# Patient Record
Sex: Female | Born: 1970 | Race: Black or African American | Hispanic: No | Marital: Married | Smoking: Current every day smoker
Health system: Southern US, Community
[De-identification: ages and names within clinical notes are randomized; demographics above are authoritative.]

## PROBLEM LIST (undated history)

## (undated) DIAGNOSIS — M7580 Other shoulder lesions, unspecified shoulder: Secondary | ICD-10-CM

## (undated) DIAGNOSIS — F32A Depression, unspecified: Secondary | ICD-10-CM

## (undated) DIAGNOSIS — G5792 Unspecified mononeuropathy of left lower limb: Secondary | ICD-10-CM

## (undated) DIAGNOSIS — M722 Plantar fascial fibromatosis: Secondary | ICD-10-CM

## (undated) DIAGNOSIS — J45909 Unspecified asthma, uncomplicated: Secondary | ICD-10-CM

## (undated) DIAGNOSIS — Z87440 Personal history of urinary (tract) infections: Secondary | ICD-10-CM

## (undated) DIAGNOSIS — K219 Gastro-esophageal reflux disease without esophagitis: Secondary | ICD-10-CM

## (undated) DIAGNOSIS — E669 Obesity, unspecified: Secondary | ICD-10-CM

## (undated) DIAGNOSIS — E119 Type 2 diabetes mellitus without complications: Secondary | ICD-10-CM

## (undated) DIAGNOSIS — R82998 Other abnormal findings in urine: Secondary | ICD-10-CM

## (undated) DIAGNOSIS — I10 Essential (primary) hypertension: Secondary | ICD-10-CM

## (undated) DIAGNOSIS — F329 Major depressive disorder, single episode, unspecified: Secondary | ICD-10-CM

## (undated) DIAGNOSIS — M1990 Unspecified osteoarthritis, unspecified site: Secondary | ICD-10-CM

## (undated) HISTORY — DX: Unspecified osteoarthritis, unspecified site: M19.90

## (undated) HISTORY — DX: Unspecified asthma, uncomplicated: J45.909

## (undated) HISTORY — DX: Gastro-esophageal reflux disease without esophagitis: K21.9

## (undated) HISTORY — PX: FOOT SURGERY: SHX648

## (undated) HISTORY — DX: Depression, unspecified: F32.A

## (undated) HISTORY — DX: Essential (primary) hypertension: I10

## (undated) HISTORY — DX: Unspecified mononeuropathy of left lower limb: G57.92

## (undated) HISTORY — DX: Other abnormal findings in urine: R82.998

## (undated) HISTORY — DX: Major depressive disorder, single episode, unspecified: F32.9

## (undated) HISTORY — DX: Other shoulder lesions, unspecified shoulder: M75.80

## (undated) HISTORY — DX: Plantar fascial fibromatosis: M72.2

## (undated) HISTORY — DX: Obesity, unspecified: E66.9

## (undated) HISTORY — DX: Personal history of urinary (tract) infections: Z87.440

## (undated) HISTORY — DX: Type 2 diabetes mellitus without complications: E11.9

---

## 2006-06-21 ENCOUNTER — Emergency Department: Payer: Self-pay | Admitting: Emergency Medicine

## 2009-04-20 ENCOUNTER — Emergency Department: Payer: Self-pay | Admitting: Emergency Medicine

## 2009-09-04 ENCOUNTER — Emergency Department: Payer: Self-pay | Admitting: Unknown Physician Specialty

## 2010-09-11 ENCOUNTER — Ambulatory Visit: Payer: Self-pay | Admitting: Family Medicine

## 2010-11-08 ENCOUNTER — Emergency Department: Payer: Self-pay | Admitting: Unknown Physician Specialty

## 2011-06-01 DIAGNOSIS — IMO0001 Reserved for inherently not codable concepts without codable children: Secondary | ICD-10-CM | POA: Insufficient documentation

## 2011-06-01 DIAGNOSIS — L309 Dermatitis, unspecified: Secondary | ICD-10-CM | POA: Insufficient documentation

## 2011-06-01 DIAGNOSIS — E669 Obesity, unspecified: Secondary | ICD-10-CM

## 2011-06-01 DIAGNOSIS — M25579 Pain in unspecified ankle and joints of unspecified foot: Secondary | ICD-10-CM | POA: Insufficient documentation

## 2011-06-01 DIAGNOSIS — E282 Polycystic ovarian syndrome: Secondary | ICD-10-CM | POA: Insufficient documentation

## 2011-06-01 DIAGNOSIS — D219 Benign neoplasm of connective and other soft tissue, unspecified: Secondary | ICD-10-CM | POA: Insufficient documentation

## 2011-06-01 DIAGNOSIS — E785 Hyperlipidemia, unspecified: Secondary | ICD-10-CM | POA: Insufficient documentation

## 2011-06-01 DIAGNOSIS — D509 Iron deficiency anemia, unspecified: Secondary | ICD-10-CM | POA: Insufficient documentation

## 2011-06-01 DIAGNOSIS — R87619 Unspecified abnormal cytological findings in specimens from cervix uteri: Secondary | ICD-10-CM | POA: Insufficient documentation

## 2011-06-01 HISTORY — DX: Obesity, unspecified: E66.9

## 2012-05-09 ENCOUNTER — Emergency Department: Payer: Self-pay | Admitting: Emergency Medicine

## 2014-05-15 HISTORY — PX: POLYPECTOMY: SHX149

## 2014-05-15 HISTORY — PX: DILATION AND CURETTAGE OF UTERUS: SHX78

## 2014-05-17 ENCOUNTER — Emergency Department: Payer: Self-pay | Admitting: Emergency Medicine

## 2014-05-29 ENCOUNTER — Ambulatory Visit: Payer: Self-pay | Admitting: Obstetrics and Gynecology

## 2014-05-29 LAB — HEMOGLOBIN A1C: HEMOGLOBIN A1C: 7.2 % — AB (ref 4.2–6.3)

## 2014-05-29 LAB — COMPREHENSIVE METABOLIC PANEL
Albumin: 3.2 g/dL — ABNORMAL LOW (ref 3.4–5.0)
Alkaline Phosphatase: 66 U/L
Anion Gap: 6 — ABNORMAL LOW (ref 7–16)
BUN: 13 mg/dL (ref 7–18)
Bilirubin,Total: 0.3 mg/dL (ref 0.2–1.0)
CHLORIDE: 106 mmol/L (ref 98–107)
Calcium, Total: 8.3 mg/dL — ABNORMAL LOW (ref 8.5–10.1)
Co2: 28 mmol/L (ref 21–32)
Creatinine: 0.59 mg/dL — ABNORMAL LOW (ref 0.60–1.30)
EGFR (African American): >60
EGFR (Non-African Amer.): >60
Glucose: 126 mg/dL — ABNORMAL HIGH (ref 65–99)
OSMOLALITY: 281 (ref 275–301)
Potassium: 3.5 mmol/L (ref 3.5–5.1)
SGOT(AST): 13 U/L — ABNORMAL LOW (ref 15–37)
SGPT (ALT): 22 U/L (ref 12–78)
Sodium: 140 mmol/L (ref 136–145)
Total Protein: 7 g/dL (ref 6.4–8.2)

## 2014-05-29 LAB — CBC
HCT: 33.4 % — ABNORMAL LOW (ref 35.0–47.0)
HGB: 10.5 g/dL — ABNORMAL LOW (ref 12.0–16.0)
MCH: 24.6 pg — AB (ref 26.0–34.0)
MCHC: 31.4 g/dL — AB (ref 32.0–36.0)
MCV: 78 fL — ABNORMAL LOW (ref 80–100)
PLATELETS: 319 10*3/uL (ref 150–440)
RBC: 4.26 10*6/uL (ref 3.80–5.20)
RDW: 15.8 % — AB (ref 11.5–14.5)
WBC: 9.1 10*3/uL (ref 3.6–11.0)

## 2014-06-04 ENCOUNTER — Ambulatory Visit: Payer: Self-pay | Admitting: Obstetrics and Gynecology

## 2014-06-06 LAB — PATHOLOGY REPORT

## 2015-02-11 ENCOUNTER — Ambulatory Visit: Payer: Self-pay | Admitting: Specialist

## 2015-02-11 LAB — BASIC METABOLIC PANEL
Anion Gap: 5 — ABNORMAL LOW (ref 7–16)
BUN: 12 mg/dL
Calcium, Total: 8.9 mg/dL
Chloride: 108 mmol/L
Co2: 29 mmol/L
Creatinine: 0.59 mg/dL
EGFR (African American): >60
EGFR (Non-African Amer.): >60
Glucose: 114 mg/dL — ABNORMAL HIGH
POTASSIUM: 4 mmol/L
SODIUM: 142 mmol/L

## 2015-02-11 LAB — HEMOGLOBIN: HGB: 13 g/dL (ref 12.0–16.0)

## 2015-02-17 ENCOUNTER — Ambulatory Visit
Admit: 2015-02-17 | Disposition: A | Discharge status: Home / Self Care | Payer: Self-pay | Attending: Specialist | Admitting: Specialist

## 2015-03-08 NOTE — Op Note (Signed)
PATIENT NAME:  Virginia [name], Virginia [name] MR#:  [id number] DATE OF BIRTH:  [birth year]  DATE OF PROCEDURE:  06/04/2014  PREOPERATIVE DIAGNOSIS: Abnormal uterine bleeding, suspected endometrial polyp.   POSTOPERATIVE DIAGNOSIS:  Abnormal uterine bleeding, suspected endometrial polyp.  PROCEDURES:  1.  Dilation and curettage.  2.  Hysteroscopy.  3.  Polypectomy.   ANESTHESIA: General.   SURGEON: Prentice Docker, M.D.   ANESTHESIA: General.   ESTIMATED BLOOD LOSS: Minimal.   OPERATIVE FLUIDS: 700 mL crystalloid.   COMPLICATIONS: None.   FINDINGS: Small anterior endometrial polypoid lesion, otherwise normal cavity.   SPECIMENS: Endometrial curettings and endometrial polyp.  CONDITION AT END OF PROCEDURE: Stable.   PROCEDURE IN DETAIL: The patient was taken to the Operating Room where general anesthesia was administered and found to be adequate. The patient was placed in the dorsal supine high lithotomy position in candy cane stirrups and prepped and draped in the usual sterile fashion.  After a timeout was called, her bladder was drained using in and out straight catheterization for approximately 75 mL of clear urine. A sterile speculum was placed in the vagina and a single-tooth tenaculum was used to grasp the anterior lip of the cervix. The cervix was dilated gently in a serial fashion using Hegar dilators to a dilatation of 6 mm. The MyoSure hysteroscope was gently introduced through the cervix into the uterine cavity with the above-noted findings. The hysteroscope was removed and gentle curettage was performed for global sampling of the uterus. The hysteroscope was reintroduced and hemostasis was verified.  In addition, the verification that the polypoid lesion has been removed was verified as well. At this point, the procedure was terminated and all instrumentation was removed from the uterus and vagina, including the single-tooth tenaculum which demonstrated hemostasis at the entry sites into  the cervix.   The patient tolerated the procedure well. Sponge, lap, and needle counts were correct x 2. For VTE prophylaxis, she was wearing pneumatic compression stockings throughout the procedure. She was awakened in the Operating Room and taken to the recovery area in stable condition.    ____________________________ Will Bonnet, MD sdj:ts D: 06/04/2014 16:46:29 ET T: 06/04/2014 17:13:50 ET JOB#: [id number]  cc: Will Bonnet, MD, <Dictator> Will Bonnet MD ELECTRONICALLY SIGNED 06/05/2014 3:00

## 2015-03-16 NOTE — Op Note (Signed)
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   POSTOPERATIVE DIAGNOSIS:  Large calcaneal exostosis left heel with Achilles bursitis.  OPERATION: Excision of large exostosis left calcaneus.   SURGEON: Park Breed, M.D.   ANESTHESIA: Spinal.   COMPLICATIONS: None.   DRAINS: None.   ESTIMATED BLOOD LOSS: None.   REPLACEMENTS:  None.   OPERATIVE PROCEDURE: The patient was brought to the operating room where she underwent satisfactory spinal anesthesia, was placed in a prone position on the operating room table, and padded appropriately.  The left leg was prepped and draped in sterile fashion and an Esmarch applied. The tourniquet was inflated to 300 mmHg.  A posterolateral incision was made paralleling the Achilles tendon distally and extending down at the lateral aspect of the calcaneus. Dissection was carried out bluntly with loupe magnification and vessels cauterized with electrocautery. The retrocalcaneal bursal space was laid opened and fatty tissue removed. The dissection was carried out down to bone at the lateral aspect calcaneus. A very large Haglund's type deformity at the dorsum of the calcaneus was identified and dissection carried out around it to isolate it.  Once this was accomplished, an oscillating saw was then used to remove the Haglund's deformity. The calcaneus was smoothed with a rasp. It appeared that the excision went down to just the right point with the Achilles tendon still being intact. Lateral fluoroscopy showed an excellent amount of bone having been removed. After further rasping of the bone, the wound was thoroughly irrigated. The Achilles was felt to be intact. Bone wax was placed over the bone and subcutaneous tissue closed with 3-0 Vicryl. The skin was closed with staples. Half percent Marcaine was placed in  the wounds. A dry sterile compression dressing with posterior splint was applied. The tourniquet was deflated with good return of blood flow to the foot. The patient was transferred into the supine position on a stretcher and taken to recovery in good condition.   ____________________________ Park Breed, MD hem:sp D: 02/17/2015 14:00:26 ET T: 02/17/2015 15:32:19 ET JOB#: [id number]  cc: Park Breed, MD, <Dictator> Park Breed MD ELECTRONICALLY SIGNED 02/19/2015 9:52

## 2015-09-16 ENCOUNTER — Ambulatory Visit (INDEPENDENT_AMBULATORY_CARE_PROVIDER_SITE_OTHER): Payer: 59 | Admitting: Family Medicine

## 2015-09-16 ENCOUNTER — Encounter: Payer: Self-pay | Admitting: Family Medicine

## 2015-09-16 VITALS — BP 124/68 | HR 95 | Temp 98.4°F | Resp 16 | Ht 67.0 in | Wt 255.8 lb

## 2015-09-16 DIAGNOSIS — IMO0001 Reserved for inherently not codable concepts without codable children: Secondary | ICD-10-CM

## 2015-09-16 DIAGNOSIS — I10 Essential (primary) hypertension: Secondary | ICD-10-CM | POA: Diagnosis not present

## 2015-09-16 DIAGNOSIS — D509 Iron deficiency anemia, unspecified: Secondary | ICD-10-CM

## 2015-09-16 DIAGNOSIS — E785 Hyperlipidemia, unspecified: Secondary | ICD-10-CM | POA: Diagnosis not present

## 2015-09-16 DIAGNOSIS — E1165 Type 2 diabetes mellitus with hyperglycemia: Secondary | ICD-10-CM | POA: Diagnosis not present

## 2015-09-16 DIAGNOSIS — G4700 Insomnia, unspecified: Secondary | ICD-10-CM | POA: Diagnosis not present

## 2015-09-16 DIAGNOSIS — J4520 Mild intermittent asthma, uncomplicated: Secondary | ICD-10-CM

## 2015-09-16 MED ORDER — METFORMIN HCL 500 MG PO TABS: 500.0000 mg | ORAL_TABLET | Freq: Two times a day (BID) | ORAL | Status: DC

## 2015-09-16 MED ORDER — ENALAPRIL MALEATE 2.5 MG PO TABS: 2.5000 mg | ORAL_TABLET | Freq: Every day | ORAL | Status: DC

## 2015-09-16 MED ORDER — TRAZODONE HCL 50 MG PO TABS: 50.0000 mg | ORAL_TABLET | Freq: Every evening | ORAL | Status: DC | PRN

## 2015-09-16 NOTE — Progress Notes (Signed)
Name: Virginia [name]   MRN: [id number]    DOB: [birth year]   Date:09/16/2015       Progress Note  Subjective  Chief Complaint  Chief Complaint  Patient presents with  . Establish Care    patient is here to get establish, but does have: HTN, DM, and Asthma  . Labs Only    HPI  Virginia [name] is a 44 y.o. female here today to transition care of medical needs to a primary care provider. She reports a history of uncontrolled DM II, HTN, HLD not on any medications. For asthma she is using albuterol rescue inhaler only as needed which is not daily. Fumes tend to trigger her symptoms of wheezing, chest tightness and cough. She had her annual retinal eye exam 08/25/15 with no diabetic retinopathy or macular edema but she does report some visual blurring at times. Not checking her glucose or blood pressure outside of doctor's offices. Otherwise she reports difficulty sleeping despite use of melatonin prn.     Past Medical History  Diagnosis Date  . Diabetes mellitus without complication ([place])   . Hypertension   . Asthma   . Arthritis   . Depression     history of     Patient Active Problem List   Diagnosis Date Noted  . Ankle pain 06/01/2011  . Type 2 diabetes mellitus ([place]) 06/01/2011  . Dermatitis, eczematoid 06/01/2011  . Fibroid 06/01/2011  . HLD (hyperlipidemia) 06/01/2011  . Microcytic anemia 06/01/2011  . Morbid obesity ([place]) 06/01/2011  . Bilateral polycystic ovarian syndrome 06/01/2011  . Abnormal Pap smear of cervix 06/01/2011    Social History  Substance Use Topics  . Smoking status: Former Research scientist (life sciences)  . Smokeless tobacco: Not on file  . Alcohol Use: No     Current outpatient prescriptions:  .  gabapentin (NEURONTIN) 400 MG capsule, Take 400 mg by mouth 3 (three) times daily., Disp: , Rfl:  .  meloxicam (MOBIC) 7.5 MG tablet, Take 7.5 mg by mouth daily., Disp: , Rfl:   Past Surgical History  Procedure Laterality Date  . Foot surgery Left   . Polypectomy   05/2014    Family History  Problem Relation Age of Onset  . Arthritis Mother   . Hypertension Mother   . Drug abuse Father   . Depression Brother   . Asthma Daughter   . Arthritis Paternal Grandmother   . Diabetes Paternal Grandmother   . Arthritis Paternal Grandfather   . Cancer Paternal Grandfather     colon  . Diabetes Paternal Grandfather   . Heart defect Daughter     ventricular septic    Allergies  Allergen Reactions  . Penicillins Other (See Comments)    Patient stated that she gets really bad yeast infections     Review of Systems  CONSTITUTIONAL: No significant weight changes, fever, chills, weakness or fatigue.  HEENT:  - Eyes: No visual changes.  - Ears: No auditory changes. No pain.  - Nose: No sneezing, congestion, runny nose. - Throat: No sore throat. No changes in swallowing. SKIN: No rash or itching.  CARDIOVASCULAR: No chest pain, chest pressure or chest discomfort. No palpitations or edema.  RESPIRATORY: No shortness of breath, cough or sputum.  GASTROINTESTINAL: No anorexia, nausea, vomiting. No changes in bowel habits. No abdominal pain or blood.  NEUROLOGICAL: No headache, dizziness, syncope, paralysis, ataxia, numbness or tingling in the extremities. No memory changes. No change in bowel or bladder control.  MUSCULOSKELETAL: No joint pain.  No muscle pain. HEMATOLOGIC: No anemia, bleeding or bruising.  LYMPHATICS: No enlarged lymph nodes.  PSYCHIATRIC: No change in mood. Yes change in sleep pattern.  ENDOCRINOLOGIC: No reports of sweating, cold or heat intolerance. No polyuria or polydipsia.     Objective  BP 124/68 mmHg  Pulse 95  Temp(Src) 98.4 F (36.9 C) (Oral)  Resp 16  Ht 5\' 7"  (1.702 m)  Wt 255 lb 12.8 oz (116.03 kg)  BMI 40.05 kg/m2  SpO2 98%  LMP 09/11/2015 Body mass index is 40.05 kg/(m^2).  Physical Exam  Constitutional: Patient is obese and well-nourished. In no distress.  HEENT:  - Head: Normocephalic and atraumatic.   - Ears: Bilateral TMs gray, no erythema or effusion - Nose: Nasal mucosa moist - Mouth/Throat: Oropharynx is clear and moist. No tonsillar hypertrophy or erythema. No post nasal drainage.  - Eyes: Conjunctivae clear, EOM movements normal. PERRLA. No scleral icterus.  Neck: Normal range of motion. Neck supple. No JVD present. No thyromegaly present.  Cardiovascular: Normal rate, regular rhythm and normal heart sounds.  No murmur heard.  Pulmonary/Chest: Effort normal and breath sounds normal. No respiratory distress. Musculoskeletal: Normal range of motion bilateral UE and LE, no joint effusions. Peripheral vascular: Bilateral LE no edema. Neurological: CN II-XII grossly intact with no focal deficits. Alert and oriented to person, place, and time. Coordination, balance, strength, speech and gait are normal.  Skin: Skin is warm and dry. No rash noted. No erythema.  Psychiatric: Patient has a normal mood and affect. Behavior is normal in office today. Judgment and thought content normal in office today.   Assessment & Plan  1. Uncontrolled type 2 diabetes mellitus without complication, without long-term current use of insulin ([place]) Patient's Hba1c goal is <6.5% for stringent control.  Patient's Hba1c goal is <7% is acceptable  Reviewed diet, exercise, lifestyle changes and current medication regimen pertaining to diabetes with the patient.   Reminded patient of the required annual dilated retinal exam.     - CBC with Differential/Platelet - Comprehensive metabolic panel - Hemoglobin A1c - TSH - metFORMIN (GLUCOPHAGE) 500 MG tablet; Take 1 tablet (500 mg total) by mouth 2 (two) times daily with a meal.  Dispense: 60 tablet; Refill: 1 - enalapril (VASOTEC) 2.5 MG tablet; Take 1 tablet (2.5 mg total) by mouth daily.  Dispense: 30 tablet; Refill: 1  2. HLD (hyperlipidemia) If elevated start statin.  - Lipid panel - TSH  3. Obesity, Class III, BMI 40-49.9 (morbid obesity)  ([place]) Needs to stop drinking sodas and sugary beverages.   4. Microcytic anemia  - Iron - Ferritin - Iron and TIBC  5. Hypertension goal BP (blood pressure) < 140/90 Start ACEi due to DM II.   - CBC with Differential/Platelet - Comprehensive metabolic panel - TSH - enalapril (VASOTEC) 2.5 MG tablet; Take 1 tablet (2.5 mg total) by mouth daily.  Dispense: 30 tablet; Refill: 1  6. Asthma, mild intermittent, well-controlled Well controled.   7. Insomnia, uncontrolled Trial of trazodone.  - traZODone (DESYREL) 50 MG tablet; Take 1-2 tablets (50-100 mg total) by mouth at bedtime as needed for sleep.  Dispense: 30 tablet; Refill: 1

## 2015-09-19 LAB — CBC WITH DIFFERENTIAL/PLATELET
BASOS ABS: 0 10*3/uL (ref 0.0–0.2)
BASOS: 0 %
EOS (ABSOLUTE): 0.2 10*3/uL (ref 0.0–0.4)
Eos: 2 %
Hematocrit: 39.5 % (ref 34.0–46.6)
Hemoglobin: 13.2 g/dL (ref 11.1–15.9)
Immature Grans (Abs): 0 10*3/uL (ref 0.0–0.1)
Immature Granulocytes: 0 %
LYMPHS ABS: 3.7 10*3/uL — AB (ref 0.7–3.1)
Lymphs: 36 %
MCH: 28 pg (ref 26.6–33.0)
MCHC: 33.4 g/dL (ref 31.5–35.7)
MCV: 84 fL (ref 79–97)
MONOS ABS: 0.6 10*3/uL (ref 0.1–0.9)
Monocytes: 5 %
Neutrophils Absolute: 5.8 10*3/uL (ref 1.4–7.0)
Neutrophils: 57 %
Platelets: 270 10*3/uL (ref 150–379)
RBC: 4.72 x10E6/uL (ref 3.77–5.28)
RDW: 14.3 % (ref 12.3–15.4)
WBC: 10.3 10*3/uL (ref 3.4–10.8)

## 2015-09-19 LAB — LIPID PANEL
CHOL/HDL RATIO: 3.8 ratio (ref 0.0–4.4)
Cholesterol, Total: 193 mg/dL (ref 100–199)
HDL: 51 mg/dL (ref >39)
LDL CALC: 121 mg/dL — AB (ref 0–99)
Triglycerides: 105 mg/dL (ref 0–149)
VLDL Cholesterol Cal: 21 mg/dL (ref 5–40)

## 2015-09-19 LAB — COMPREHENSIVE METABOLIC PANEL
ALBUMIN: 4.1 g/dL (ref 3.5–5.5)
ALT: 24 IU/L (ref 0–32)
AST: 20 IU/L (ref 0–40)
Albumin/Globulin Ratio: 1.6 (ref 1.1–2.5)
Alkaline Phosphatase: 76 IU/L (ref 39–117)
BUN / CREAT RATIO: 20 (ref 9–23)
BUN: 14 mg/dL (ref 6–24)
Bilirubin Total: <0.2 mg/dL (ref 0.0–1.2)
CALCIUM: 9.6 mg/dL (ref 8.7–10.2)
CO2: 24 mmol/L (ref 18–29)
CREATININE: 0.69 mg/dL (ref 0.57–1.00)
Chloride: 101 mmol/L (ref 97–106)
GFR calc Af Amer: 123 mL/min/{1.73_m2} (ref >59)
GFR, EST NON AFRICAN AMERICAN: 107 mL/min/{1.73_m2} (ref >59)
GLOBULIN, TOTAL: 2.6 g/dL (ref 1.5–4.5)
Glucose: 121 mg/dL — ABNORMAL HIGH (ref 65–99)
Potassium: 4.4 mmol/L (ref 3.5–5.2)
SODIUM: 140 mmol/L (ref 136–144)
Total Protein: 6.7 g/dL (ref 6.0–8.5)

## 2015-09-19 LAB — IRON AND TIBC
Iron Saturation: 26 % (ref 15–55)
Iron: 75 ug/dL (ref 27–159)
Total Iron Binding Capacity: 286 ug/dL (ref 250–450)
UIBC: 211 ug/dL (ref 131–425)

## 2015-09-19 LAB — FERRITIN: Ferritin: 101 ng/mL (ref 15–150)

## 2015-09-19 LAB — HEMOGLOBIN A1C
Est. average glucose Bld gHb Est-mCnc: 157 mg/dL
Hgb A1c MFr Bld: 7.1 % — ABNORMAL HIGH (ref 4.8–5.6)

## 2015-09-19 LAB — TSH: TSH: 0.762 u[IU]/mL (ref 0.450–4.500)

## 2015-12-18 ENCOUNTER — Encounter: Payer: Self-pay | Admitting: Emergency Medicine

## 2015-12-18 ENCOUNTER — Emergency Department
Admission: EM | Admit: 2015-12-18 | Discharge: 2015-12-18 | Disposition: A | Discharge status: Home / Self Care | Payer: 59 | Attending: Student | Admitting: Student

## 2015-12-18 DIAGNOSIS — Z7984 Long term (current) use of oral hypoglycemic drugs: Secondary | ICD-10-CM | POA: Insufficient documentation

## 2015-12-18 DIAGNOSIS — Z880 Allergy status to penicillin: Secondary | ICD-10-CM | POA: Insufficient documentation

## 2015-12-18 DIAGNOSIS — Z791 Long term (current) use of non-steroidal anti-inflammatories (NSAID): Secondary | ICD-10-CM | POA: Insufficient documentation

## 2015-12-18 DIAGNOSIS — Z87891 Personal history of nicotine dependence: Secondary | ICD-10-CM | POA: Insufficient documentation

## 2015-12-18 DIAGNOSIS — E119 Type 2 diabetes mellitus without complications: Secondary | ICD-10-CM | POA: Diagnosis not present

## 2015-12-18 DIAGNOSIS — I10 Essential (primary) hypertension: Secondary | ICD-10-CM | POA: Insufficient documentation

## 2015-12-18 DIAGNOSIS — Z79899 Other long term (current) drug therapy: Secondary | ICD-10-CM | POA: Insufficient documentation

## 2015-12-18 DIAGNOSIS — A0811 Acute gastroenteropathy due to Norwalk agent: Secondary | ICD-10-CM | POA: Diagnosis not present

## 2015-12-18 DIAGNOSIS — R112 Nausea with vomiting, unspecified: Secondary | ICD-10-CM | POA: Diagnosis present

## 2015-12-18 LAB — CBC
HEMATOCRIT: 44.6 % (ref 35.0–47.0)
HEMOGLOBIN: 15.1 g/dL (ref 12.0–16.0)
MCH: 27.7 pg (ref 26.0–34.0)
MCHC: 33.8 g/dL (ref 32.0–36.0)
MCV: 81.9 fL (ref 80.0–100.0)
Platelets: 221 10*3/uL (ref 150–440)
RBC: 5.45 MIL/uL — ABNORMAL HIGH (ref 3.80–5.20)
RDW: 14 % (ref 11.5–14.5)
WBC: 10.2 10*3/uL (ref 3.6–11.0)

## 2015-12-18 LAB — URINALYSIS COMPLETE WITH MICROSCOPIC (ARMC ONLY)
Bilirubin Urine: NEGATIVE
GLUCOSE, UA: NEGATIVE mg/dL
HGB URINE DIPSTICK: NEGATIVE
LEUKOCYTES UA: NEGATIVE
NITRITE: NEGATIVE
Protein, ur: 30 mg/dL — AB
SPECIFIC GRAVITY, URINE: 1.027 (ref 1.005–1.030)
pH: 5 (ref 5.0–8.0)

## 2015-12-18 LAB — COMPREHENSIVE METABOLIC PANEL
ALK PHOS: 70 U/L (ref 38–126)
ALT: 28 U/L (ref 14–54)
ANION GAP: 8 (ref 5–15)
AST: 26 U/L (ref 15–41)
Albumin: 3.9 g/dL (ref 3.5–5.0)
BILIRUBIN TOTAL: 1 mg/dL (ref 0.3–1.2)
BUN: 14 mg/dL (ref 6–20)
CALCIUM: 8.8 mg/dL — AB (ref 8.9–10.3)
CO2: 22 mmol/L (ref 22–32)
Chloride: 107 mmol/L (ref 101–111)
Creatinine, Ser: 0.54 mg/dL (ref 0.44–1.00)
GFR calc Af Amer: >60 mL/min (ref >60)
GLUCOSE: 118 mg/dL — AB (ref 65–99)
POTASSIUM: 3.8 mmol/L (ref 3.5–5.1)
Sodium: 137 mmol/L (ref 135–145)
TOTAL PROTEIN: 7.7 g/dL (ref 6.5–8.1)

## 2015-12-18 LAB — GLUCOSE, CAPILLARY: Glucose-Capillary: 109 mg/dL — ABNORMAL HIGH (ref 65–99)

## 2015-12-18 LAB — LIPASE, BLOOD: Lipase: 16 U/L (ref 11–51)

## 2015-12-18 MED ORDER — LOPERAMIDE HCL 2 MG PO TABS: 2.0000 mg | ORAL_TABLET | Freq: Four times a day (QID) | ORAL | Status: AC | PRN

## 2015-12-18 MED ORDER — ONDANSETRON HCL 4 MG/2ML IJ SOLN
4.0000 mg | Freq: Once | INTRAMUSCULAR | Status: AC
  Administered 2015-12-18: 4 mg via INTRAVENOUS
  Filled 2015-12-18: qty 2

## 2015-12-18 MED ORDER — SODIUM CHLORIDE 0.9 % IV BOLUS (SEPSIS)
500.0000 mL | Freq: Once | INTRAVENOUS | Status: AC
  Administered 2015-12-18: 500 mL via INTRAVENOUS

## 2015-12-18 MED ORDER — ONDANSETRON 4 MG PO TBDP: 4.0000 mg | ORAL_TABLET | Freq: Three times a day (TID) | ORAL | Status: DC | PRN

## 2015-12-18 NOTE — Discharge Instructions (Signed)
Norovirus Infection °A norovirus infection is caused by exposure to a virus in a group of similar viruses (noroviruses). This type of infection causes inflammation in your stomach and intestines (gastroenteritis). Norovirus is the most common cause of gastroenteritis. It also causes food poisoning. °Anyone can get a norovirus infection. It spreads very easily (contagious). You can get it from contaminated food, water, surfaces, or other people. Norovirus is found in the stool or vomit of infected people. You can spread the infection as soon as you feel sick until 2 weeks after you recover.  °Symptoms usually begin within 2 days after you become infected. Most norovirus symptoms affect the digestive system. °CAUSES °Norovirus infection is caused by contact with norovirus. You can catch norovirus if you: °· Eat or drink something contaminated with norovirus. °· Touch surfaces or objects contaminated with norovirus and then put your hand in your mouth. °· Have direct contact with an infected person who has symptoms. °· Share food, drink, or utensils with someone with who is sick with norovirus. °SIGNS AND SYMPTOMS °Symptoms of norovirus may include: °· Nausea. °· Vomiting. °· Diarrhea. °· Stomach cramps. °· Fever. °· Chills. °· Headache. °· Muscle aches. °· Tiredness. °DIAGNOSIS °Your health care provider may suspect norovirus based on your symptoms and physical exam. Your health care provider may also test a sample of your stool or vomit for the virus.  °TREATMENT °There is no specific treatment for norovirus. Most people get better without treatment in about 2 days. °HOME CARE INSTRUCTIONS °· Replace lost fluids by drinking plenty of water or rehydration fluids containing important minerals called electrolytes. This prevents dehydration. Drink enough fluid to keep your urine clear or pale yellow. °· Do not prepare food for others while you are infected. Wait at least 3 days after recovering from the illness to do  that. °PREVENTION  °· Wash your hands often, especially after using the toilet or changing a diaper. °· Wash fruits and vegetables thoroughly before preparing or serving them. °· Throw out any food that a sick person may have touched. °· Disinfect contaminated surfaces immediately after someone in the household has been sick. Use a bleach-based household cleaner. °· Immediately remove and wash soiled clothes or sheets. °SEEK MEDICAL CARE IF: °· Your vomiting, diarrhea, and stomach pain is getting worse. °· Your symptoms of norovirus do not go away after 2-3 days. °SEEK IMMEDIATE MEDICAL CARE IF:  °You develop symptoms of dehydration that do not improve with fluid replacement. This may include: °· Excessive sleepiness. °· Lack of tears. °· Dry mouth. °· Dizziness when standing. °· Weak pulse. °  °This information is not intended to replace advice given to you by your health care provider. Make sure you discuss any questions you have with your health care provider. °  °Document Released: 01/22/2003 Document Revised: 11/22/2014 Document Reviewed: 04/11/2014 °Elsevier Interactive Patient Education ©2016 [employer] ° °

## 2015-12-18 NOTE — ED Provider Notes (Signed)
[facility] Emergency Department Provider Note  ____________________________________________  Time seen: Approximately 4:44 PM  I have reviewed the triage vital signs and the nursing notes.   HISTORY  Chief Complaint Nausea; Emesis; and Diarrhea    HPI Virginia [name] is a 45 y.o. female who presents emergency department complaining of nausea, vomiting, diarrhea starting yesterday. 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 Patient states that symptoms began rather abruptly yesterday. She has had some low-grade tactile fevers and chills, nausea, no vomiting, diarrhea. She is able to keep liquids down but states that even drinking well cause her stomach cramp. Patient has tried over-the-counter medications with no relief.   Past Medical History  Diagnosis Date  . Diabetes mellitus without complication ([place])   . Hypertension   . Asthma   . Arthritis   . Depression     history of     Patient Active Problem List   Diagnosis Date Noted  . Hypertension goal BP (blood pressure) < 140/90 09/16/2015  . Asthma, mild intermittent, well-controlled 09/16/2015  . Insomnia, uncontrolled 09/16/2015  . Ankle pain 06/01/2011  . Uncontrolled diabetes mellitus type 2 without complications ([place]) 123456  . Dermatitis, eczematoid 06/01/2011  . Fibroid 06/01/2011  . HLD (hyperlipidemia) 06/01/2011  . Microcytic anemia 06/01/2011  . Obesity, Class III, BMI 40-49.9 (morbid obesity) ([place]) 06/01/2011  . Bilateral polycystic ovarian syndrome 06/01/2011  . Abnormal Pap smear of cervix 06/01/2011    Past Surgical History  Procedure Laterality Date  . Foot surgery Left   . Polypectomy  05/2014    Current Outpatient Rx  Name  Route  Sig  Dispense  Refill  . albuterol (PROVENTIL HFA;VENTOLIN HFA) 108 (90 BASE) MCG/ACT inhaler   Inhalation   Inhale 1 puff into the lungs every 6  (six) hours as needed.         . enalapril (VASOTEC) 2.5 MG tablet   Oral   Take 1 tablet (2.5 mg total) by mouth daily.   30 tablet   1   . gabapentin (NEURONTIN) 400 MG capsule   Oral   Take 400 mg by mouth 3 (three) times daily.         Marland Kitchen loperamide (IMODIUM A-D) 2 MG tablet   Oral   Take 1 tablet (2 mg total) by mouth 4 (four) times daily as needed for diarrhea or loose stools.   30 tablet   0   . meloxicam (MOBIC) 7.5 MG tablet   Oral   Take 7.5 mg by mouth daily.         . metFORMIN (GLUCOPHAGE) 500 MG tablet   Oral   Take 1 tablet (500 mg total) by mouth 2 (two) times daily with a meal.   60 tablet   1   . ondansetron (ZOFRAN-ODT) 4 MG disintegrating tablet   Oral   Take 1 tablet (4 mg total) by mouth every 8 (eight) hours as needed for nausea or vomiting.   20 tablet   0   . traZODone (DESYREL) 50 MG tablet   Oral   Take 1-2 tablets (50-100 mg total) by mouth at bedtime as needed for sleep.   30 tablet   1     Allergies Penicillins  Family History  Problem Relation Age of Onset  . Arthritis Mother   . Hypertension Mother   . Drug abuse Father   . Depression Brother   .  Asthma Daughter   . Arthritis Paternal Grandmother   . Diabetes Paternal Grandmother   . Arthritis Paternal Grandfather   . Cancer Paternal Grandfather     colon  . Diabetes Paternal Grandfather   . Heart defect Daughter     ventricular septic    Social History Social History  Substance Use Topics  . Smoking status: Former Research scientist (life sciences)  . Smokeless tobacco: None  . Alcohol Use: No     Review of Systems  Constitutional: Endorses fever/chills Cardiovascular: no chest pain. Respiratory: no cough. No SOB. Gastrointestinal: Positive for diffuse abdominal pain.  Positive for nausea and vomiting.  Positive for diarrhea.  No constipation. Genitourinary: Negative for dysuria. No hematuria Musculoskeletal: Negative for back pain. Skin: Negative for rash. Neurological:  Negative for headaches, focal weakness or numbness. 10-point ROS otherwise negative.  ____________________________________________   PHYSICAL EXAM:  VITAL SIGNS: ED Triage Vitals  Enc Vitals Group     BP 12/18/15 1437 138/66 mmHg     Pulse Rate 12/18/15 1437 85     Resp 12/18/15 1437 18     Temp 12/18/15 1437 98 F (36.7 C)     Temp src --      SpO2 12/18/15 1437 98 %     Weight 12/18/15 1437 246 lb (111.585 kg)     Height 12/18/15 1437 5\' 7"  (1.702 m)     Head Cir --      Peak Flow --      Pain Score 12/18/15 1439 4     Pain Loc --      Pain Edu? --      Excl. in [place]? --      Constitutional: Alert and oriented. Well appearing and in no acute distress. Eyes: Conjunctivae are normal. PERRL. EOMI. Head: Atraumatic.      Mouth/Throat: Mucous membranes are moist.  Neck: No stridor.   Hematological/Lymphatic/Immunilogical: No cervical lymphadenopathy. Cardiovascular: Normal rate, regular rhythm. Normal S1 and S2.  Good peripheral circulation. Respiratory: Normal respiratory effort without tachypnea or retractions. Lungs CTAB. Gastrointestinal: Soft and nontender to palpation. No guarding. No rigidity. No palpable masses.. No distention. No CVA tenderness. Musculoskeletal: No lower extremity tenderness nor edema.  No joint effusions. Neurologic:  Normal speech and language. No gross focal neurologic deficits are appreciated.  Skin:  Skin is warm, dry and intact. No rash noted. Psychiatric: Mood and affect are normal. Speech and behavior are normal. Patient exhibits appropriate insight and judgement.   ____________________________________________   LABS (all labs ordered are listed, but only abnormal results are displayed)  Labs Reviewed  COMPREHENSIVE METABOLIC PANEL - Abnormal; Notable for the following:    Glucose, Bld 118 (*)    Calcium 8.8 (*)    All other components within normal limits  CBC - Abnormal; Notable for the following:    RBC 5.45 (*)    All other  components within normal limits  URINALYSIS COMPLETEWITH MICROSCOPIC ([facility] ONLY) - Abnormal; Notable for the following:    Color, Urine YELLOW (*)    APPearance CLEAR (*)    Ketones, ur TRACE (*)    Protein, ur 30 (*)    Bacteria, UA RARE (*)    Squamous Epithelial / LPF 0-5 (*)    All other components within normal limits  GLUCOSE, CAPILLARY - Abnormal; Notable for the following:    Glucose-Capillary 109 (*)    All other components within normal limits  LIPASE, BLOOD   ____________________________________________  EKG   ____________________________________________  RADIOLOGY  No results found.  ____________________________________________    PROCEDURES  Procedure(s) performed:       Medications  sodium chloride 0.9 % bolus 500 mL (500 mLs Intravenous New Bag/Given 12/18/15 1711)  ondansetron (ZOFRAN) injection 4 mg (4 mg Intravenous Given 12/18/15 1713)     ____________________________________________   INITIAL IMPRESSION / ASSESSMENT AND PLAN / ED COURSE  Pertinent labs & imaging results that were available during my care of the patient were reviewed by me and considered in my medical decision making (see chart for details).  Patient's diagnosis is consistent with norovirus. The patient's known contact with condition as well as negative lab work and no acute findings on physical exam are consistent with above diagnosis.. Patient was given IV fluids and antiemetics in the emergency department with good relief.. Patient will be discharged home with prescriptions for antibiotics as well as antidiarrheal agents. She is also encouraged to take over-the-counter probiotics for additional symptom control.. Patient is to follow up with primary care provider if symptoms persist past this treatment course. Patient is given ED precautions to return to the ED for any worsening or new symptoms.     ____________________________________________  FINAL CLINICAL IMPRESSION(S)  / ED DIAGNOSES  Final diagnoses:  Norovirus      NEW MEDICATIONS STARTED DURING THIS VISIT:  New Prescriptions   LOPERAMIDE (IMODIUM A-D) 2 MG TABLET    Take 1 tablet (2 mg total) by mouth 4 (four) times daily as needed for diarrhea or loose stools.   ONDANSETRON (ZOFRAN-ODT) 4 MG DISINTEGRATING TABLET    Take 1 tablet (4 mg total) by mouth every 8 (eight) hours as needed for nausea or vomiting.        Charline Bills Alaja Goldinger, PA-C 12/18/15 [place], MD 12/19/15 [phone]

## 2015-12-18 NOTE — ED Notes (Signed)
Pt presents with n/v/d started yesterday.

## 2016-01-23 ENCOUNTER — Emergency Department
Admission: EM | Admit: 2016-01-23 | Discharge: 2016-01-24 | Disposition: A | Discharge status: Home / Self Care | Payer: 59 | Attending: Emergency Medicine | Admitting: Emergency Medicine

## 2016-01-23 ENCOUNTER — Encounter: Payer: Self-pay | Admitting: Emergency Medicine

## 2016-01-23 DIAGNOSIS — Z87891 Personal history of nicotine dependence: Secondary | ICD-10-CM | POA: Diagnosis not present

## 2016-01-23 DIAGNOSIS — F329 Major depressive disorder, single episode, unspecified: Secondary | ICD-10-CM | POA: Diagnosis not present

## 2016-01-23 DIAGNOSIS — N390 Urinary tract infection, site not specified: Secondary | ICD-10-CM | POA: Diagnosis not present

## 2016-01-23 DIAGNOSIS — Z7984 Long term (current) use of oral hypoglycemic drugs: Secondary | ICD-10-CM | POA: Diagnosis not present

## 2016-01-23 DIAGNOSIS — Z880 Allergy status to penicillin: Secondary | ICD-10-CM | POA: Diagnosis not present

## 2016-01-23 DIAGNOSIS — J45909 Unspecified asthma, uncomplicated: Secondary | ICD-10-CM | POA: Insufficient documentation

## 2016-01-23 DIAGNOSIS — R1012 Left upper quadrant pain: Secondary | ICD-10-CM | POA: Diagnosis present

## 2016-01-23 DIAGNOSIS — E119 Type 2 diabetes mellitus without complications: Secondary | ICD-10-CM | POA: Insufficient documentation

## 2016-01-23 DIAGNOSIS — I10 Essential (primary) hypertension: Secondary | ICD-10-CM | POA: Insufficient documentation

## 2016-01-23 DIAGNOSIS — Z79899 Other long term (current) drug therapy: Secondary | ICD-10-CM | POA: Insufficient documentation

## 2016-01-23 DIAGNOSIS — R1010 Upper abdominal pain, unspecified: Secondary | ICD-10-CM

## 2016-01-23 LAB — COMPREHENSIVE METABOLIC PANEL
ALK PHOS: 61 U/L (ref 38–126)
ALT: 27 U/L (ref 14–54)
AST: 27 U/L (ref 15–41)
Albumin: 3.6 g/dL (ref 3.5–5.0)
Anion gap: 8 (ref 5–15)
BILIRUBIN TOTAL: 0.4 mg/dL (ref 0.3–1.2)
BUN: 11 mg/dL (ref 6–20)
CALCIUM: 8.9 mg/dL (ref 8.9–10.3)
CO2: 25 mmol/L (ref 22–32)
CREATININE: 0.73 mg/dL (ref 0.44–1.00)
Chloride: 104 mmol/L (ref 101–111)
GFR calc non Af Amer: >60 mL/min (ref >60)
GLUCOSE: 232 mg/dL — AB (ref 65–99)
Potassium: 3.3 mmol/L — ABNORMAL LOW (ref 3.5–5.1)
SODIUM: 137 mmol/L (ref 135–145)
TOTAL PROTEIN: 7 g/dL (ref 6.5–8.1)

## 2016-01-23 LAB — URINALYSIS COMPLETE WITH MICROSCOPIC (ARMC ONLY)
BILIRUBIN URINE: NEGATIVE
Bacteria, UA: NONE SEEN
Hgb urine dipstick: NEGATIVE
KETONES UR: NEGATIVE mg/dL
Nitrite: NEGATIVE
Protein, ur: 30 mg/dL — AB
Specific Gravity, Urine: 1.018 (ref 1.005–1.030)
pH: 5 (ref 5.0–8.0)

## 2016-01-23 LAB — CBC
HCT: 42.9 % (ref 35.0–47.0)
Hemoglobin: 14.2 g/dL (ref 12.0–16.0)
MCH: 27.6 pg (ref 26.0–34.0)
MCHC: 33.2 g/dL (ref 32.0–36.0)
MCV: 83.2 fL (ref 80.0–100.0)
PLATELETS: 213 10*3/uL (ref 150–440)
RBC: 5.15 MIL/uL (ref 3.80–5.20)
RDW: 14.4 % (ref 11.5–14.5)
WBC: 8.6 10*3/uL (ref 3.6–11.0)

## 2016-01-23 NOTE — ED Notes (Signed)
Pt states mid to upper abd pain since 0700 this am with nausea. Pt states last po intake at 1800, [employer] with no increase in pain. Pt states is nauseated, denies vomiting, fever or diarrhea. Pt states "it's sore".pt appears in no acute distress.

## 2016-01-23 NOTE — ED Notes (Signed)
Pt roomed to ED 4, Rn notified. Pt given warm blanket

## 2016-01-24 ENCOUNTER — Emergency Department: Payer: 59

## 2016-01-24 LAB — PREGNANCY, URINE: PREG TEST UR: NEGATIVE

## 2016-01-24 LAB — LIPASE, BLOOD: LIPASE: 15 U/L (ref 11–51)

## 2016-01-24 MED ORDER — PANTOPRAZOLE SODIUM 40 MG PO TBEC: 40.0000 mg | DELAYED_RELEASE_TABLET | Freq: Every day | ORAL | Status: DC

## 2016-01-24 MED ORDER — MORPHINE SULFATE (PF) 4 MG/ML IV SOLN
4.0000 mg | Freq: Once | INTRAVENOUS | Status: AC
  Administered 2016-01-24: 4 mg via INTRAVENOUS
  Filled 2016-01-24: qty 1

## 2016-01-24 MED ORDER — TRAMADOL HCL 50 MG PO TABS: 50.0000 mg | ORAL_TABLET | Freq: Four times a day (QID) | ORAL | Status: DC | PRN

## 2016-01-24 MED ORDER — FOSFOMYCIN TROMETHAMINE 3 G PO PACK
3.0000 g | PACK | Freq: Once | ORAL | Status: AC
  Administered 2016-01-24: 3 g via ORAL
  Filled 2016-01-24: qty 3

## 2016-01-24 MED ORDER — ONDANSETRON HCL 4 MG/2ML IJ SOLN
4.0000 mg | Freq: Once | INTRAMUSCULAR | Status: AC
  Administered 2016-01-24: 4 mg via INTRAVENOUS
  Filled 2016-01-24: qty 2

## 2016-01-24 MED ORDER — IOHEXOL 300 MG/ML  SOLN
100.0000 mL | Freq: Once | INTRAMUSCULAR | Status: AC | PRN
  Administered 2016-01-24: 100 mL via INTRAVENOUS

## 2016-01-24 MED ORDER — IOHEXOL 240 MG/ML SOLN
25.0000 mL | Freq: Once | INTRAMUSCULAR | Status: AC | PRN
  Administered 2016-01-24: 25 mL via ORAL

## 2016-01-24 NOTE — Discharge Instructions (Signed)

## 2016-01-24 NOTE — ED Provider Notes (Signed)
[facility] Emergency Department Provider Note  Time seen: 12:23 AM  I have reviewed the triage vital signs and the nursing notes.   HISTORY  Chief Complaint Abdominal Pain    HPI Virginia [name] is a 45 y.o. female with a past medical history of diabetes, hypertension, asthma, depression, arthritis who presents the emergency department with upper abdominal pain which began this morning. According to the patient since 7 AM this morning she has been having 4/10 pain in the epigastrium to left side of her abdomen. Denies any worsening of pain with eating. Patient does state nausea but denies any vomiting, fever, diarrhea, chest pain. States the pain is worse when she was hitting bumps in the road on the way to the emergency department.Describes the pain as a dull aching sensation in the upper to left abdomen for/10 in severity.     Past Medical History  Diagnosis Date  . Diabetes mellitus without complication ([place])   . Hypertension   . Asthma   . Arthritis   . Depression     history of     Patient Active Problem List   Diagnosis Date Noted  . Hypertension goal BP (blood pressure) < 140/90 09/16/2015  . Asthma, mild intermittent, well-controlled 09/16/2015  . Insomnia, uncontrolled 09/16/2015  . Ankle pain 06/01/2011  . Uncontrolled diabetes mellitus type 2 without complications ([place]) 123456  . Dermatitis, eczematoid 06/01/2011  . Fibroid 06/01/2011  . HLD (hyperlipidemia) 06/01/2011  . Microcytic anemia 06/01/2011  . Obesity, Class III, BMI 40-49.9 (morbid obesity) ([place]) 06/01/2011  . Bilateral polycystic ovarian syndrome 06/01/2011  . Abnormal Pap smear of cervix 06/01/2011    Past Surgical History  Procedure Laterality Date  . Foot surgery Left   . Polypectomy  05/2014    Current Outpatient Rx  Name  Route  Sig  Dispense  Refill  . albuterol (PROVENTIL HFA;VENTOLIN HFA) 108 (90 BASE) MCG/ACT inhaler   Inhalation   Inhale 1 puff into  the lungs every 6 (six) hours as needed.         . enalapril (VASOTEC) 2.5 MG tablet   Oral   Take 1 tablet (2.5 mg total) by mouth daily.   30 tablet   1   . gabapentin (NEURONTIN) 400 MG capsule   Oral   Take 400 mg by mouth 3 (three) times daily.         Marland Kitchen loperamide (IMODIUM A-D) 2 MG tablet   Oral   Take 1 tablet (2 mg total) by mouth 4 (four) times daily as needed for diarrhea or loose stools.   30 tablet   0   . meloxicam (MOBIC) 7.5 MG tablet   Oral   Take 7.5 mg by mouth daily.         . metFORMIN (GLUCOPHAGE) 500 MG tablet   Oral   Take 1 tablet (500 mg total) by mouth 2 (two) times daily with a meal.   60 tablet   1   . ondansetron (ZOFRAN-ODT) 4 MG disintegrating tablet   Oral   Take 1 tablet (4 mg total) by mouth every 8 (eight) hours as needed for nausea or vomiting.   20 tablet   0   . traZODone (DESYREL) 50 MG tablet   Oral   Take 1-2 tablets (50-100 mg total) by mouth at bedtime as needed for sleep.   30 tablet   1     Allergies Penicillins  Family History  Problem Relation Age of Onset  .  Arthritis Mother   . Hypertension Mother   . Drug abuse Father   . Depression Brother   . Asthma Daughter   . Arthritis Paternal Grandmother   . Diabetes Paternal Grandmother   . Arthritis Paternal Grandfather   . Cancer Paternal Grandfather     colon  . Diabetes Paternal Grandfather   . Heart defect Daughter     ventricular septic    Social History Social History  Substance Use Topics  . Smoking status: Former Research scientist (life sciences)  . Smokeless tobacco: Never Used  . Alcohol Use: No    Review of Systems Constitutional: Negative for fever Cardiovascular: Negative for chest pain. Respiratory: Negative for shortness of breath. Gastrointestinal: Upper and left-sided abdominal pain. Negative for vomiting or diarrhea. Positive for nausea. Genitourinary: Negative for dysuria. Neurological: Negative for headache  10-point ROS otherwise  negative.  ____________________________________________   PHYSICAL EXAM:  VITAL SIGNS: ED Triage Vitals  Enc Vitals Group     BP 01/23/16 2039 170/91 mmHg     Pulse Rate 01/23/16 2039 104     Resp 01/23/16 2039 18     Temp 01/23/16 2039 98.5 F (36.9 C)     Temp Source 01/23/16 2039 Oral     SpO2 01/23/16 2039 100 %     Weight 01/23/16 2039 230 lb (104.327 kg)     Height 01/23/16 2039 5\' 7"  (1.702 m)     Head Cir --      Peak Flow --      Pain Score 01/23/16 2040 6     Pain Loc --      Pain Edu? --      Excl. in [place]? --     Constitutional: Alert and oriented. Well appearing and in no distress. Eyes: Normal exam ENT   Head: Normocephalic and atraumatic.   Mouth/Throat: Mucous membranes are moist. Cardiovascular: Normal rate, regular rhythm. No murmur Respiratory: Normal respiratory effort without tachypnea nor retractions. Breath sounds are clear Gastrointestinal: Soft, moderate epigastric and left-sided abdominal tenderness to palpation. No rebound or guarding noted distention. Musculoskeletal: Nontender with normal range of motion in all extremities. Neurologic:  Normal speech and language. No gross focal neurologic deficits Skin:  Skin is warm, dry and intact.  Psychiatric: Mood and affect are normal. Speech and behavior are normal.   ____________________________________________   EKG reviewed and interpreted by myself shows normal sinus rhythm at 88 bpm, narrow QRS, normal axis, normal intervals, no ST changes. Normal EKG.   INITIAL IMPRESSION / ASSESSMENT AND PLAN / ED COURSE  Pertinent labs & imaging results that were available during my care of the patient were reviewed by me and considered in my medical decision making (see chart for details).  Patient with 4/10 pain to the epigastric and left abdomen. Mild-moderate enderness to palpation. we will proceed with a CT abdomen/pelvis to further evaluate. Patient's labs today are largely within normal limits.  Slight urinary tract infection although I do not believe this to be the cause of the patient's symptoms. We will treat with a one-time dose of fosfomycin.  CT negative. We'll dose a one-time dose of fosfomycin. We'll discharge patient home with primary care follow-up. Patient agreeable plan. ____________________________________________   FINAL CLINICAL IMPRESSION(S) / ED DIAGNOSES  UTI Abdominal pain   Harvest Dark, MD 01/24/16 [phone]

## 2016-01-30 ENCOUNTER — Ambulatory Visit: Payer: 59 | Admitting: Family Medicine

## 2016-04-06 ENCOUNTER — Other Ambulatory Visit: Payer: Self-pay | Admitting: Specialist

## 2016-04-15 ENCOUNTER — Encounter: Payer: Self-pay | Admitting: Family Medicine

## 2016-04-15 ENCOUNTER — Ambulatory Visit (INDEPENDENT_AMBULATORY_CARE_PROVIDER_SITE_OTHER): Payer: 59 | Admitting: Family Medicine

## 2016-04-15 VITALS — BP 122/84 | HR 99 | Temp 98.5°F | Resp 16 | Wt 251.4 lb

## 2016-04-15 DIAGNOSIS — K219 Gastro-esophageal reflux disease without esophagitis: Secondary | ICD-10-CM

## 2016-04-15 DIAGNOSIS — G5792 Unspecified mononeuropathy of left lower limb: Secondary | ICD-10-CM

## 2016-04-15 DIAGNOSIS — G8929 Other chronic pain: Secondary | ICD-10-CM

## 2016-04-15 DIAGNOSIS — I10 Essential (primary) hypertension: Secondary | ICD-10-CM

## 2016-04-15 DIAGNOSIS — IMO0001 Reserved for inherently not codable concepts without codable children: Secondary | ICD-10-CM

## 2016-04-15 DIAGNOSIS — D509 Iron deficiency anemia, unspecified: Secondary | ICD-10-CM | POA: Diagnosis not present

## 2016-04-15 DIAGNOSIS — E669 Obesity, unspecified: Secondary | ICD-10-CM

## 2016-04-15 DIAGNOSIS — Z01818 Encounter for other preprocedural examination: Secondary | ICD-10-CM

## 2016-04-15 DIAGNOSIS — E785 Hyperlipidemia, unspecified: Secondary | ICD-10-CM

## 2016-04-15 DIAGNOSIS — Z87440 Personal history of urinary (tract) infections: Secondary | ICD-10-CM

## 2016-04-15 DIAGNOSIS — E1165 Type 2 diabetes mellitus with hyperglycemia: Secondary | ICD-10-CM | POA: Diagnosis not present

## 2016-04-15 DIAGNOSIS — R635 Abnormal weight gain: Secondary | ICD-10-CM | POA: Diagnosis not present

## 2016-04-15 DIAGNOSIS — J4520 Mild intermittent asthma, uncomplicated: Secondary | ICD-10-CM

## 2016-04-15 DIAGNOSIS — G4700 Insomnia, unspecified: Secondary | ICD-10-CM

## 2016-04-15 DIAGNOSIS — N926 Irregular menstruation, unspecified: Secondary | ICD-10-CM

## 2016-04-15 DIAGNOSIS — R81 Glycosuria: Secondary | ICD-10-CM | POA: Diagnosis not present

## 2016-04-15 DIAGNOSIS — Z5181 Encounter for therapeutic drug level monitoring: Secondary | ICD-10-CM | POA: Diagnosis not present

## 2016-04-15 DIAGNOSIS — M79671 Pain in right foot: Secondary | ICD-10-CM

## 2016-04-15 HISTORY — DX: Gastro-esophageal reflux disease without esophagitis: K21.9

## 2016-04-15 HISTORY — DX: Unspecified mononeuropathy of left lower limb: G57.92

## 2016-04-15 HISTORY — DX: Personal history of urinary (tract) infections: Z87.440

## 2016-04-15 MED ORDER — ENALAPRIL MALEATE 2.5 MG PO TABS: 2.5000 mg | ORAL_TABLET | Freq: Every day | ORAL | Status: AC

## 2016-04-15 MED ORDER — MELOXICAM 15 MG PO TABS: 15.0000 mg | ORAL_TABLET | Freq: Every day | ORAL | Status: AC

## 2016-04-15 MED ORDER — GABAPENTIN 400 MG PO CAPS: 400.0000 mg | ORAL_CAPSULE | Freq: Three times a day (TID) | ORAL | Status: AC

## 2016-04-15 MED ORDER — RANITIDINE HCL 150 MG PO TABS: 150.0000 mg | ORAL_TABLET | Freq: Two times a day (BID) | ORAL | Status: AC | PRN

## 2016-04-15 MED ORDER — METFORMIN HCL 500 MG PO TABS: 1000.0000 mg | ORAL_TABLET | Freq: Two times a day (BID) | ORAL | Status: AC

## 2016-04-15 NOTE — Assessment & Plan Note (Addendum)
Patient planning for surgery in a few weeks; need to get glucose under control to assist with proper healing, reduce infection; checking labs today; EKG done today; NSR, no ST-T wave abnormalities; will see her back in 1-2 weeks for re-evaluation, see if we can provide clearance at that time; warned about using meloxicam AND ibuprofen, can be dangerous for kidneys; use one OR the other, not both; will increase meloxicam to 15 mg daily instead per her preference; okay to use plain tylenol per package directions

## 2016-04-15 NOTE — Assessment & Plan Note (Signed)
Check lipids today 

## 2016-04-15 NOTE — Assessment & Plan Note (Addendum)
Check urine today 

## 2016-04-15 NOTE — Assessment & Plan Note (Signed)
Explained this is quite abnormal, indicative of out of control diabetes; I cannot clear her for surgery right now; will get diabetes under control; increase metformin; return in 1-2 weeks

## 2016-04-15 NOTE — Assessment & Plan Note (Addendum)
Hx of anemia with pica prior to Virginia [name]; check CBC today; add on ferritin, TIBC if still anemic

## 2016-04-15 NOTE — Assessment & Plan Note (Signed)
Controlled today; recommend DASH guidelines

## 2016-04-15 NOTE — Patient Instructions (Addendum)
Please don't take both meloxicam and ibuprofen Either take maximum 15 mg of meloxicam every 24 hours OR Ibuprofen 800 mg every 8 hours (maximum is 2400 mg per 24 hours) Okay to take tylenol per package directions Try turmeric as a natural anti-inflammatory (for pain and arthritis). It comes in capsules where you buy aspirin and fish oil, but also as a spice where you buy pepper and garlic powder.  Your goal blood pressure is less than 130 mmHg on top. Try to follow the DASH guidelines (DASH stands for Dietary Approaches to Stop Hypertension) Try to limit the sodium in your diet.  Ideally, consume less than 1.5 grams (less than 1,500mg ) per day. Do not add salt when cooking or at the table.  Check the sodium amount on labels when shopping, and choose items lower in sodium when given a choice. Avoid or limit foods that already contain a lot of sodium. Eat a diet rich in fruits and vegetables and whole grains.  Let's get fasting labs today  Please do see your eye doctor regularly, and have your eyes examined every year (or more often per his or her recommendation) Check your feet every night and let me know right away of any sores, infections, numbness, etc. Try to limit sweets, white bread, white rice, white potatoes It is okay with me for you to not check your fingerstick blood sugars (per [employer] of Endocrinology Best Practices), unless you are interested and feel it would be helpful for you  Try melatonin 3 mg at night at the exact same time of night for 3 weeks Return in 1-2 weeks for pre-op

## 2016-04-15 NOTE — Assessment & Plan Note (Addendum)
Check A1c and urine microalbumin; foot exam by MD; check lipids; increase metformin; work on weight loss, cut out sugary drinks, eye exam UTD per patient; return in 1-2 weeks to go over labs; may add medicine to help lower glucose and assist in weight loss

## 2016-04-15 NOTE — Progress Notes (Signed)
BP 122/84 mmHg  Pulse 99  Temp(Src) 98.5 F (36.9 C) (Oral)  Resp 16  Wt 251 lb 6.4 oz (114.034 kg)  SpO2 96%  LMP 04/13/2016 (Approximate)   Subjective:    Patient ID: Virginia [name], female    DOB: Apr 25, 1971, 46 y.o.   MRN: [id number]  HPI: Virginia [name] is a 45 y.o. female  Chief Complaint  Patient presents with  . surgical clearance    right foot   Patient is new to me and has not been seen at this practice for 7 months; she has diabetes and has not been seeing anyone else for this; just lapsed on her treatment and follow-up; has not been taking metformin regularly  Going to have surgery on the right heel on June 21st; has had problems for years; swelling and ankle gets stuck and pops; pain; prolonged walking makes it worse; using gabapentin and meloxicam; when pain gets bad, she'll take extra ibuprofen 800 mg; that is actually in addition to the meloxicam; but rarely takes meloxicam Does have some numbness on the left lateral aspect of the left foot after surgery; no numbness in teh toes; no calluses or sores on the feet; checks feet regularly; gets pedicure  She has type 2 diabetes, has not had any regular f/u, last visit in Nov 2016 Does have dry mouth; increased water intake; no blurred vision; getting up at night to urinate every hour Has tried to cut out sugary drinks; just checks FSBS occasionally, when feeling sugar is off  Harder to lose weight as she gets older  Surveyor, minerals to doctor a month ago, had a boil on her bottom; also has UTI; ER labs reviewed, >500 glucose noted along with WBCs  Asthma; triggered by cleaning products; not using rescue inhaler more than 2x a week  Used to have anemia; had pica, would eat flour (pica); heavy periods; wearing max tampons and pads; no blood transfusions; irregular cycles; had D&C (updated surg hx) and that took care of the heavy bleeding  Had issues with stomach; was taking PPI; was taking TUMS; caution, can try Zantac;  no blood in stool  Also has trouble sleeping; used a sleeping pill but didn't like how it made her feel; stopped it; using magnesium; trazodone made her feel weird; has heard of melatonin  Depression screen [facility] 2/9 04/15/2016 09/16/2015  Decreased Interest 0 0  Down, Depressed, Hopeless 0 0  PHQ - 2 Score 0 0   Relevant past medical, surgical, family and social history reviewed Past Medical History  Diagnosis Date  . Diabetes mellitus without complication ([place])   . Hypertension   . Asthma   . Arthritis   . Depression     history of   . Plantar fasciitis of right foot   . AC (acromioclavicular) joint bone spurs   . Recent urinary tract infection 04/15/2016  . Obesity (BMI 30-39.9) 06/01/2011  . Acid reflux 04/15/2016  . Neuropathy of left foot 04/15/2016   Past Surgical History  Procedure Laterality Date  . Foot surgery Left   . Polypectomy  05/2014  . Dilation and curettage of uterus  July 2015    [facility]   Family History  Problem Relation Age of Onset  . Arthritis Mother   . Hypertension Mother   . Drug abuse Father   . Depression Brother   . Asthma Daughter   . Arthritis Paternal Grandmother   . Diabetes Paternal Grandmother   . Arthritis Paternal Grandfather   .  Cancer Paternal Grandfather     colon  . Diabetes Paternal Grandfather   . Heart defect Daughter     ventricular septic  two grandparents had diabetes; used insulin  Social History  Substance Use Topics  . Smoking status: Former Smoker    Types: Cigarettes  . Smokeless tobacco: Never Used  . Alcohol Use: No   Interim medical history since last visit reviewed. Allergies and medications reviewed  Review of Systems  Constitutional: Positive for unexpected weight change.  Endocrine: Positive for polydipsia and polyuria.  Genitourinary: Positive for frequency. Negative for dysuria, difficulty urinating and pelvic pain.  Hematological: Does not bruise/bleed easily.   Per HPI unless specifically  indicated above     Objective:    BP 122/84 mmHg  Pulse 99  Temp(Src) 98.5 F (36.9 C) (Oral)  Resp 16  Wt 251 lb 6.4 oz (114.034 kg)  SpO2 96%  LMP 04/13/2016 (Approximate)  Wt Readings from Last 3 Encounters:  04/15/16 251 lb 6.4 oz (114.034 kg)  01/23/16 230 lb (104.327 kg)  12/18/15 246 lb (111.585 kg)   body mass index is 39.37 kg/(m^2).  Physical Exam  Constitutional: She appears well-developed and well-nourished. No distress.  HENT:  Head: Normocephalic and atraumatic.  Eyes: EOM are normal. No scleral icterus.  Neck: No thyromegaly present.  Cardiovascular: Normal rate, regular rhythm and normal heart sounds.   No murmur heard. Pulmonary/Chest: Effort normal and breath sounds normal. No respiratory distress. She has no wheezes.  Abdominal: Soft. Bowel sounds are normal. She exhibits no distension.  Musculoskeletal: Normal range of motion. She exhibits no edema.  Neurological: She is alert. She exhibits normal muscle tone.  Skin: Skin is warm and dry. She is not diaphoretic. No pallor.  Psychiatric: She has a normal mood and affect. Her behavior is normal. Judgment and thought content normal.   Diabetic Foot Form - Detailed   Diabetic Foot Exam - detailed  Diabetic Foot exam was performed with the following findings:  Yes 04/15/2016  9:38 AM  Visual Foot Exam completed.:  Yes  Are the toenails long?:  No  Are the toenails thick?:  No  Are the toenails ingrown?:  No  Normal Range of Motion:  Yes    Pulse Foot Exam completed.:  Yes  Right Dorsalis Pedis:  Present Left Dorsalis Pedis:  Present  Sensory Foot Exam Completed.:  Yes  Semmes-Weinstein Monofilament Test  R Site 1-Great Toe:  Pos L Site 1-Great Toe:  Pos  R Site 4:  Pos L Site 4:  Pos  R Site 5:  Pos L Site 5:  Pos    Comments:  Numbness along the lateral aspect LEFT foot distal to surgical site scar; no erythema     Results for orders placed or performed during the hospital encounter of 01/23/16    Comprehensive metabolic panel  Result Value Ref Range   Sodium 137 135 - 145 mmol/L   Potassium 3.3 (L) 3.5 - 5.1 mmol/L   Chloride 104 101 - 111 mmol/L   CO2 25 22 - 32 mmol/L   Glucose, Bld 232 (H) 65 - 99 mg/dL   BUN 11 6 - 20 mg/dL   Creatinine, Ser 0.73 0.44 - 1.00 mg/dL   Calcium 8.9 8.9 - 10.3 mg/dL   Total Protein 7.0 6.5 - 8.1 g/dL   Albumin 3.6 3.5 - 5.0 g/dL   AST 27 15 - 41 U/L   ALT 27 14 - 54 U/L   Alkaline Phosphatase 61  38 - 126 U/L   Total Bilirubin 0.4 0.3 - 1.2 mg/dL   GFR calc non Af Amer >60 >60 mL/min   GFR calc Af Amer >60 >60 mL/min   Anion gap 8 5 - 15  CBC  Result Value Ref Range   WBC 8.6 3.6 - 11.0 K/uL   RBC 5.15 3.80 - 5.20 MIL/uL   Hemoglobin 14.2 12.0 - 16.0 g/dL   HCT 42.9 35.0 - 47.0 %   MCV 83.2 80.0 - 100.0 fL   MCH 27.6 26.0 - 34.0 pg   MCHC 33.2 32.0 - 36.0 g/dL   RDW 14.4 11.5 - 14.5 %   Platelets 213 150 - 440 K/uL  Urinalysis complete, with microscopic ([facility] only)  Result Value Ref Range   Color, Urine YELLOW (A) YELLOW   APPearance CLEAR (A) CLEAR   Glucose, UA >500 (A) NEGATIVE mg/dL   Bilirubin Urine NEGATIVE NEGATIVE   Ketones, ur NEGATIVE NEGATIVE mg/dL   Specific Gravity, Urine 1.018 1.005 - 1.030   Hgb urine dipstick NEGATIVE NEGATIVE   pH 5.0 5.0 - 8.0   Protein, ur 30 (A) NEGATIVE mg/dL   Nitrite NEGATIVE NEGATIVE   Leukocytes, UA TRACE (A) NEGATIVE   RBC / HPF 0-5 0 - 5 RBC/hpf   WBC, UA 6-30 0 - 5 WBC/hpf   Bacteria, UA NONE SEEN NONE SEEN   Squamous Epithelial / LPF 0-5 (A) NONE SEEN   Mucous PRESENT   Lipase, blood  Result Value Ref Range   Lipase 15 11 - 51 U/L  Pregnancy, urine  Result Value Ref Range   Preg Test, Ur NEGATIVE NEGATIVE      Assessment & Plan:   Problem List Items Addressed This Visit      Cardiovascular and Mediastinum   Hypertension goal BP (blood pressure) < 140/90    Controlled today; recommend DASH guidelines      Relevant Medications   enalapril (VASOTEC) 2.5 MG tablet      Respiratory   Asthma, mild intermittent, well-controlled    Doing well; call me if using rescue inhaler more than 2x a week        Digestive   Acid reflux    Warned about long-term use of PPIs; no red flags; will switch to H2 blocker      Relevant Medications   ranitidine (ZANTAC) 150 MG tablet     Nervous and Auditory   Neuropathy of left foot    Continue gabapentin; explained that this medicine should never be stopped abruptly; refill provided      Relevant Medications   gabapentin (NEURONTIN) 400 MG capsule     Other   Chronic pain of right heel    Patient planning for surgery in a few weeks; need to get glucose under control to assist with proper healing, reduce infection; checking labs today; EKG done today; NSR, no ST-T wave abnormalities; will see her back in 1-2 weeks for re-evaluation, see if we can provide clearance at that time; warned about using meloxicam AND ibuprofen, can be dangerous for kidneys; use one OR the other, not both; will increase meloxicam to 15 mg daily instead per her preference; okay to use plain tylenol per package directions      Relevant Medications   meloxicam (MOBIC) 15 MG tablet   gabapentin (NEURONTIN) 400 MG capsule   Glucosuria    Explained this is quite abnormal, indicative of out of control diabetes; I cannot clear her for surgery right now; will get  diabetes under control; increase metformin; return in 1-2 weeks      HLD (hyperlipidemia)    Check lipids today      Relevant Medications   enalapril (VASOTEC) 2.5 MG tablet   Insomnia, uncontrolled    Suggested melatonin 3 mg at the exact same time of night for 3 weeks      Medication monitoring encounter   Relevant Orders   Comprehensive metabolic panel   Microcytic anemia    Hx of anemia with pica prior to [facility]; check CBC today; add on ferritin, TIBC if still anemic      Relevant Orders   CBC with Differential/Platelet   Obesity (BMI 30-39.9)    Will check TSH given the  21 pound weight gain, but will encourage her to work on healthier eating; increase metformin; consider adding another agent for her diabetes which will promote weight loss (GLP-1, SGLT2, etc.)      Relevant Medications   metFORMIN (GLUCOPHAGE) 500 MG tablet   Recent urinary tract infection    Check urine today      Relevant Orders   UA/M w/rflx Culture, Routine   Uncontrolled diabetes mellitus type 2 without complications ([facility]) - Primary    Check A1c and urine microalbumin; foot exam by MD; check lipids; increase metformin; work on weight loss, cut out sugary drinks, eye exam UTD per patient; return in 1-2 weeks to go over labs; may add medicine to help lower glucose and assist in weight loss      Relevant Medications   metFORMIN (GLUCOPHAGE) 500 MG tablet   enalapril (VASOTEC) 2.5 MG tablet   Other Relevant Orders   Hemoglobin A1c   Lipid Panel w/o Chol/HDL Ratio   Microalbumin / creatinine urine ratio    Other Visit Diagnoses    Pre-op evaluation        Relevant Orders    EKG 12-Lead    Abnormal weight gain        check TSH today    Relevant Orders    TSH    T4, free    Irregular periods        check TSH, LH, FSH    Relevant Orders    Follicle stimulating hormone    Luteinizing hormone       Follow up plan: Return 1-2 weeks, for pre-op clearance.  An after-visit summary was printed and given to the patient at [facility].  Please see the patient instructions which may contain other information and recommendations beyond what is mentioned above in the assessment and plan.  Meds ordered this encounter  Medications  . metFORMIN (GLUCOPHAGE) 500 MG tablet    Sig: Take 2 tablets (1,000 mg total) by mouth 2 (two) times daily with a meal.    Dispense:  120 tablet    Refill:  2    New instructions  . ranitidine (ZANTAC) 150 MG tablet    Sig: Take 1 tablet (150 mg total) by mouth 2 (two) times daily as needed for heartburn. This replaces pantoprazole    Dispense:  60  tablet    Refill:  2  . meloxicam (MOBIC) 15 MG tablet    Sig: Take 1 tablet (15 mg total) by mouth daily. (do not take with ibuprofen)    Dispense:  30 tablet    Refill:  1    Pharmacy - I'm increasing the dose  . gabapentin (NEURONTIN) 400 MG capsule    Sig: Take 1 capsule (400 mg total) by mouth 3 (three)  times daily. (don't stop abruptly)    Dispense:  90 capsule    Refill:  5  . enalapril (VASOTEC) 2.5 MG tablet    Sig: Take 1 tablet (2.5 mg total) by mouth daily. For BP and kidneys    Dispense:  30 tablet    Refill:  2   Orders Placed This Encounter  Procedures  . Hemoglobin A1c  . Comprehensive metabolic panel  . Follicle stimulating hormone  . Luteinizing hormone  . Lipid Panel w/o Chol/HDL Ratio  . TSH  . T4, free  . UA/M w/rflx Culture, Routine  . CBC with Differential/Platelet  . Microalbumin / creatinine urine ratio  . EKG 12-Lead   Face-to-face time with patient was more than 40 minutes, >50% time spent counseling and coordination of care

## 2016-04-15 NOTE — Assessment & Plan Note (Signed)
Doing well; call me if using rescue inhaler more than 2x a week

## 2016-04-15 NOTE — Assessment & Plan Note (Signed)
Will check TSH given the 21 pound weight gain, but will encourage her to work on healthier eating; increase metformin; consider adding another agent for her diabetes which will promote weight loss (GLP-1, SGLT2, etc.)

## 2016-04-15 NOTE — Assessment & Plan Note (Signed)
Warned about long-term use of PPIs; no red flags; will switch to H2 blocker

## 2016-04-15 NOTE — Assessment & Plan Note (Signed)
Continue gabapentin; explained that this medicine should never be stopped abruptly; refill provided

## 2016-04-15 NOTE — Assessment & Plan Note (Signed)
Suggested melatonin 3 mg at the exact same time of night for 3 weeks

## 2016-04-16 LAB — CBC WITH DIFFERENTIAL/PLATELET
BASOS ABS: 0 10*3/uL (ref 0.0–0.2)
Basos: 1 %
EOS (ABSOLUTE): 0.2 10*3/uL (ref 0.0–0.4)
EOS: 2 %
HEMATOCRIT: 42.2 % (ref 34.0–46.6)
Hemoglobin: 14.1 g/dL (ref 11.1–15.9)
Immature Grans (Abs): 0 10*3/uL (ref 0.0–0.1)
Immature Granulocytes: 0 %
LYMPHS ABS: 3 10*3/uL (ref 0.7–3.1)
Lymphs: 38 %
MCH: 27.6 pg (ref 26.6–33.0)
MCHC: 33.4 g/dL (ref 31.5–35.7)
MCV: 83 fL (ref 79–97)
MONOS ABS: 0.8 10*3/uL (ref 0.1–0.9)
Monocytes: 10 %
Neutrophils Absolute: 3.9 10*3/uL (ref 1.4–7.0)
Neutrophils: 49 %
Platelets: 255 10*3/uL (ref 150–379)
RBC: 5.1 x10E6/uL (ref 3.77–5.28)
RDW: 14.7 % (ref 12.3–15.4)
WBC: 7.9 10*3/uL (ref 3.4–10.8)

## 2016-04-16 LAB — COMPREHENSIVE METABOLIC PANEL
A/G RATIO: 1.6 (ref 1.2–2.2)
ALK PHOS: 71 IU/L (ref 39–117)
ALT: 23 IU/L (ref 0–32)
AST: 22 IU/L (ref 0–40)
Albumin: 4.1 g/dL (ref 3.5–5.5)
BILIRUBIN TOTAL: 0.2 mg/dL (ref 0.0–1.2)
BUN / CREAT RATIO: 16 (ref 9–23)
BUN: 11 mg/dL (ref 6–24)
CO2: 25 mmol/L (ref 18–29)
CREATININE: 0.7 mg/dL (ref 0.57–1.00)
Calcium: 9.3 mg/dL (ref 8.7–10.2)
Chloride: 99 mmol/L (ref 96–106)
GFR calc Af Amer: 123 mL/min/{1.73_m2} (ref >59)
GFR calc non Af Amer: 106 mL/min/{1.73_m2} (ref >59)
GLOBULIN, TOTAL: 2.5 g/dL (ref 1.5–4.5)
Glucose: 134 mg/dL — ABNORMAL HIGH (ref 65–99)
POTASSIUM: 3.9 mmol/L (ref 3.5–5.2)
SODIUM: 140 mmol/L (ref 134–144)
Total Protein: 6.6 g/dL (ref 6.0–8.5)

## 2016-04-16 LAB — LIPID PANEL W/O CHOL/HDL RATIO
Cholesterol, Total: 189 mg/dL (ref 100–199)
HDL: 48 mg/dL (ref >39)
LDL Calculated: 119 mg/dL — ABNORMAL HIGH (ref 0–99)
Triglycerides: 111 mg/dL (ref 0–149)
VLDL Cholesterol Cal: 22 mg/dL (ref 5–40)

## 2016-04-16 LAB — MICROSCOPIC EXAMINATION: CASTS: NONE SEEN /LPF

## 2016-04-16 LAB — FOLLICLE STIMULATING HORMONE: FSH: 6.5 m[IU]/mL

## 2016-04-16 LAB — UA/M W/RFLX CULTURE, ROUTINE
Bilirubin, UA: NEGATIVE
KETONES UA: NEGATIVE
Leukocytes, UA: NEGATIVE
NITRITE UA: NEGATIVE
UUROB: 0.2 mg/dL (ref 0.2–1.0)
pH, UA: 5.5 (ref 5.0–7.5)

## 2016-04-16 LAB — HEMOGLOBIN A1C
Est. average glucose Bld gHb Est-mCnc: 171 mg/dL
HEMOGLOBIN A1C: 7.6 % — AB (ref 4.8–5.6)

## 2016-04-16 LAB — LUTEINIZING HORMONE: LH: 10.4 m[IU]/mL

## 2016-04-16 LAB — MICROALBUMIN / CREATININE URINE RATIO
Creatinine, Urine: 257.7 mg/dL
MICROALB/CREAT RATIO: 38 mg/g creat — ABNORMAL HIGH (ref 0.0–30.0)
MICROALBUM., U, RANDOM: 97.9 ug/mL

## 2016-04-16 LAB — T4, FREE: FREE T4: 1.12 ng/dL (ref 0.82–1.77)

## 2016-04-16 LAB — TSH: TSH: 0.771 u[IU]/mL (ref 0.450–4.500)

## 2016-04-26 ENCOUNTER — Encounter
Admission: RE | Admit: 2016-04-26 | Discharge: 2016-04-26 | Disposition: A | Discharge status: Home / Self Care | Payer: 59 | Source: Ambulatory Visit | Attending: Specialist | Admitting: Specialist

## 2016-04-26 DIAGNOSIS — Z01818 Encounter for other preprocedural examination: Secondary | ICD-10-CM | POA: Diagnosis present

## 2016-04-26 NOTE — Patient Instructions (Signed)
  Your procedure is scheduled on: 05/05/16 Wed Report to Same Day Surgery 2nd floor medical mall To find out your arrival time please call [phone] between 1PM - 3PM on 05/04/16 Tues Remember: Instructions that are not followed completely may result in serious medical risk, up to and including death, or upon the discretion of your surgeon and anesthesiologist your surgery may need to be rescheduled.    _x___ 1. Do not eat food or drink liquids after midnight. No gum chewing or hard candies.     ____ 2. No Alcohol for 24 hours before or after surgery.   ____ 3. Bring all medications with you on the day of surgery if instructed.    __x__ 4. Notify your doctor if there is any change in your medical condition     (cold, fever, infections).     Do not wear jewelry, make-up, hairpins, clips or nail polish.  Do not wear lotions, powders, or perfumes. You may wear deodorant.  Do not shave 48 hours prior to surgery. Men may shave face and neck.  Do not bring valuables to the hospital.    [facility] is not responsible for any belongings or valuables.               Contacts, dentures or bridgework may not be worn into surgery.  Leave your suitcase in the car. After surgery it may be brought to your room.  For patients admitted to the hospital, discharge time is determined by your treatment team.   Patients discharged the day of surgery will not be allowed to drive home.    Please read over the following fact sheets that you were given:   [facility] Preparing for Surgery and or MRSA Information   _x___ Take these medicines the morning of surgery with A SIP OF WATER:    1. albuterol (PROVENTIL HFA;VENTOLIN HFA) 108 (90 BASE) MCG/ACT inhaler  2.enalapril (VASOTEC) 2.5 MG tablet  3.ranitidine (ZANTAC) 150 MG tablet  4.  5.  6.  ____ Fleet Enema (as directed)   _x___ Use CHG Soap or sage wipes as directed on instruction sheet   ____ Use inhalers on the day of surgery and bring to  hospital day of surgery  _x___ Stop metformin 2 days prior to surgery    ____ Take 1/2 of usual insulin dose the night before surgery and none on the morning of           surgery.   ____ Stop aspirin or coumadin, or plavix  _x__ Stop Anti-inflammatories such as Advil, Aleve, Ibuprofen, Motrin, Naproxen,          Naprosyn, Goodies powders or aspirin products. Ok to take Tylenol Stop.meloxicam (MOBIC) 15 MG tablet 1 week before surgery   ____ Stop supplements until after surgery.    ____ Bring C-Pap to the hospital.

## 2016-04-30 ENCOUNTER — Other Ambulatory Visit: Payer: Self-pay | Admitting: Family Medicine

## 2016-04-30 ENCOUNTER — Ambulatory Visit (INDEPENDENT_AMBULATORY_CARE_PROVIDER_SITE_OTHER): Payer: 59 | Admitting: Family Medicine

## 2016-04-30 ENCOUNTER — Encounter: Payer: Self-pay | Admitting: Family Medicine

## 2016-04-30 VITALS — BP 122/76 | HR 93 | Temp 99.0°F | Resp 16 | Wt 249.0 lb

## 2016-04-30 DIAGNOSIS — Z114 Encounter for screening for human immunodeficiency virus [HIV]: Secondary | ICD-10-CM | POA: Diagnosis not present

## 2016-04-30 DIAGNOSIS — Z87440 Personal history of urinary (tract) infections: Secondary | ICD-10-CM

## 2016-04-30 DIAGNOSIS — M7661 Achilles tendinitis, right leg: Secondary | ICD-10-CM

## 2016-04-30 DIAGNOSIS — Z01818 Encounter for other preprocedural examination: Secondary | ICD-10-CM | POA: Diagnosis not present

## 2016-04-30 DIAGNOSIS — R8299 Other abnormal findings in urine: Secondary | ICD-10-CM | POA: Diagnosis not present

## 2016-04-30 DIAGNOSIS — E669 Obesity, unspecified: Secondary | ICD-10-CM | POA: Diagnosis not present

## 2016-04-30 DIAGNOSIS — R82998 Other abnormal findings in urine: Secondary | ICD-10-CM

## 2016-04-30 HISTORY — DX: Other abnormal findings in urine: R82.998

## 2016-04-30 NOTE — Progress Notes (Signed)
BP 122/76 mmHg  Pulse 93  Temp(Src) 99 F (37.2 C) (Oral)  Resp 16  Wt 249 lb (112.946 kg)  SpO2 96%  LMP 04/30/2016   Subjective:    Patient ID: Virginia [name], female    DOB: Dec 03, 1970, 45 y.o.   MRN: [id number]  HPI: Virginia [name] is a 45 y.o. female  Chief Complaint  Patient presents with  . surgical clearance   Ready to have foot surgery; no sores or boils on the skin; no cough; no fevers; no dysuria; no problems with anesthesia; not easy bruiser or bleeding  She has been drinking plenty of water; avoiding dark sodas and teas Crystals; one twin had kidney stones, father also; was eating calcium chews like candy for stomach problems; stomach problems better on medicine  A1c a little bit elevated on last check; reviewed together; metformin helps her not eat as much; not trying to finish all of her portions; doing leftovers now  Not many eggs anymore, some cheese; LDL 119, goal under 100; not taking anything for cholesterol; used to be oncholesterol medicine but previous doctor stopped it; not sure why  Depression screen [facility] 2/9 04/30/2016 04/15/2016 09/16/2015  Decreased Interest 0 0 0  Down, Depressed, Hopeless 0 0 0  PHQ - 2 Score 0 0 0   Relevant past medical, surgical, family and social history reviewed Past Medical History  Diagnosis Date  . Diabetes mellitus without complication ([place])   . Hypertension   . Asthma   . Arthritis   . Depression     history of   . Plantar fasciitis of right foot   . AC (acromioclavicular) joint bone spurs   . Recent urinary tract infection 04/15/2016  . Obesity (BMI 30-39.9) 06/01/2011  . Acid reflux 04/15/2016  . Neuropathy of left foot 04/15/2016  . Calcium oxalate crystals in urine 04/30/2016   Past Surgical History  Procedure Laterality Date  . Foot surgery Left   . Polypectomy  05/2014  . Dilation and curettage of uterus  July 2015    [facility]  . Calcaneal osteotomy Right 05/05/2016    Procedure: CALCANEAL  OSTEOTOMY;  Surgeon: Earnestine Leys, MD;  Location: [facility] ORS;  Service: Orthopedics;  Laterality: Right;   Family History  Problem Relation Age of Onset  . Arthritis Mother   . Hypertension Mother   . Drug abuse Father   . Depression Brother   . Asthma Daughter   . Arthritis Paternal Grandmother   . Diabetes Paternal Grandmother   . Arthritis Paternal Grandfather   . Cancer Paternal Grandfather     colon  . Diabetes Paternal Grandfather   . Heart defect Daughter     ventricular septic   Social History  Substance Use Topics  . Smoking status: Former Smoker    Types: Cigarettes    Quit date: 04/26/2006  . Smokeless tobacco: Never Used  . Alcohol Use: No   Interim medical history since last visit reviewed. Allergies and medications reviewed  Review of Systems Per HPI unless specifically indicated above     Objective:    BP 122/76 mmHg  Pulse 93  Temp(Src) 99 F (37.2 C) (Oral)  Resp 16  Wt 249 lb (112.946 kg)  SpO2 96%  LMP 04/30/2016  Wt Readings from Last 3 Encounters:  04/30/16 249 lb (112.946 kg)  04/26/16 251 lb (113.853 kg)  04/15/16 251 lb 6.4 oz (114.034 kg)  body mass index is 38.99 kg/(m^2).  Physical Exam  Constitutional: She  appears well-developed and well-nourished. No distress.  HENT:  Head: Normocephalic and atraumatic.  Eyes: EOM are normal. Right eye exhibits no discharge. Left eye exhibits no discharge. No scleral icterus.  Neck: No JVD present. No thyromegaly present.  Cardiovascular: Normal rate and regular rhythm.   Pulmonary/Chest: Effort normal and breath sounds normal.  Abdominal: Soft. Bowel sounds are normal. She exhibits no distension. There is no tenderness. There is no guarding.  Musculoskeletal: She exhibits no edema.  Lymphadenopathy:    She has no cervical adenopathy.  Neurological: She is alert.  Skin: Skin is warm and dry. She is not diaphoretic. No pallor.  Psychiatric: She has a normal mood and affect.   Results for  orders placed or performed in visit on 04/30/16  Urinalysis w microscopic + reflex cultur  Result Value Ref Range   Color, Urine DARK YELLOW YELLOW   APPearance CLEAR CLEAR   Specific Gravity, Urine 1.029 1.001 - 1.035   pH 6.0 5.0 - 8.0   Glucose, UA NEGATIVE NEGATIVE   Bilirubin Urine NEGATIVE NEGATIVE   Ketones, ur NEGATIVE NEGATIVE   Hgb urine dipstick 3+ (A) NEGATIVE   Protein, ur 1+ (A) NEGATIVE   Nitrite NEGATIVE NEGATIVE   Leukocytes, UA NEGATIVE NEGATIVE   WBC, UA 0-5 <=5 WBC/HPF   RBC / HPF 0-2 <=2 RBC/HPF   Squamous Epithelial / LPF 0-5 <=5 HPF   Bacteria, UA FEW (A) NONE SEEN HPF   Crystals NONE SEEN NONE SEEN HPF   Casts NONE SEEN NONE SEEN LPF   Yeast NONE SEEN NONE SEEN HPF      Assessment & Plan:   Problem List Items Addressed This Visit      Musculoskeletal and Integument   Achilles tendonitis     Other   Calcium oxalate crystals in urine    Avoid dark sodas, teas; stay hydrated; avoid calcium chews      Obesity (BMI 30-39.9)    Couple of pounds of weight loss, acknowledged      Pre-operative exam - Primary    Patient cleared for upcoming foot/ankle surgery      Recent urinary tract infection    Recheck urine today prior to surgery, treat if any infection      Relevant Orders   UA/M w/rflx Culture, Routine   Screening for HIV (human immunodeficiency virus)   Relevant Orders   HIV antibody      Follow up plan: No Follow-up on file.  An after-visit summary was printed and given to the patient at [place].  Please see the patient instructions which may contain other information and recommendations beyond what is mentioned above in the assessment and plan.  No orders of the defined types were placed in this encounter.    Orders Placed This Encounter  Procedures  . HIV antibody  . UA/M w/rflx Culture, Routine

## 2016-04-30 NOTE — Patient Instructions (Signed)
Stay hydrated Avoid calcium supplements Recheck urine today Return in September for next labs and diabetes visit Try to lose 10-15 pounds before you next visit Try to limit saturated fats in your diet (bologna, hot dogs, barbeque, cheeseburgers, hamburgers, steak, bacon, sausage, cheese, etc.) and get more fresh fruits, vegetables, and whole grains I'm clearing you for surgery

## 2016-04-30 NOTE — Assessment & Plan Note (Signed)
Avoid dark sodas, teas; stay hydrated; avoid calcium chews

## 2016-04-30 NOTE — Assessment & Plan Note (Addendum)
Recheck urine today prior to surgery, treat if any infection

## 2016-05-01 LAB — URINALYSIS W MICROSCOPIC + REFLEX CULTURE
Bilirubin Urine: NEGATIVE
CASTS: NONE SEEN [LPF]
Crystals: NONE SEEN [HPF]
GLUCOSE, UA: NEGATIVE
Ketones, ur: NEGATIVE
LEUKOCYTES UA: NEGATIVE
NITRITE: NEGATIVE
Specific Gravity, Urine: 1.029 (ref 1.001–1.035)
YEAST: NONE SEEN [HPF]
pH: 6 (ref 5.0–8.0)

## 2016-05-03 ENCOUNTER — Telehealth: Payer: Self-pay | Admitting: Family Medicine

## 2016-05-03 NOTE — Telephone Encounter (Signed)
Pt.notified

## 2016-05-03 NOTE — Telephone Encounter (Signed)
PT IS HAVING FOOT SURGERY AND IS HAVING A HEALVY CYCLE AND WANTS TO KNOW IF SHE CAN BE WEARING A TAMPON OR NOT DURING SURGERY. SURGERY IS ON WED June 21ST

## 2016-05-03 NOTE — Telephone Encounter (Signed)
I am not aware of any problem with her wearing a tampon; have her contact her surgeon/anesthesiologist

## 2016-05-04 ENCOUNTER — Other Ambulatory Visit: Payer: Self-pay | Admitting: Family Medicine

## 2016-05-04 DIAGNOSIS — R3129 Other microscopic hematuria: Secondary | ICD-10-CM | POA: Insufficient documentation

## 2016-05-04 DIAGNOSIS — R809 Proteinuria, unspecified: Secondary | ICD-10-CM

## 2016-05-05 ENCOUNTER — Ambulatory Visit
Admission: RE | Admit: 2016-05-05 | Discharge: 2016-05-05 | Disposition: A | Discharge status: Home / Self Care | Payer: 59 | Source: Ambulatory Visit | Attending: Specialist | Admitting: Specialist

## 2016-05-05 ENCOUNTER — Encounter: Payer: Self-pay | Admitting: *Deleted

## 2016-05-05 ENCOUNTER — Ambulatory Visit: Payer: 59 | Admitting: Anesthesiology

## 2016-05-05 ENCOUNTER — Encounter
Admission: RE | Disposition: A | Discharge status: Home / Self Care | Payer: Self-pay | Source: Ambulatory Visit | Attending: Specialist

## 2016-05-05 DIAGNOSIS — M25571 Pain in right ankle and joints of right foot: Secondary | ICD-10-CM

## 2016-05-05 DIAGNOSIS — Z7984 Long term (current) use of oral hypoglycemic drugs: Secondary | ICD-10-CM | POA: Insufficient documentation

## 2016-05-05 DIAGNOSIS — E119 Type 2 diabetes mellitus without complications: Secondary | ICD-10-CM | POA: Diagnosis not present

## 2016-05-05 DIAGNOSIS — F329 Major depressive disorder, single episode, unspecified: Secondary | ICD-10-CM | POA: Diagnosis not present

## 2016-05-05 DIAGNOSIS — I10 Essential (primary) hypertension: Secondary | ICD-10-CM | POA: Diagnosis not present

## 2016-05-05 DIAGNOSIS — M1990 Unspecified osteoarthritis, unspecified site: Secondary | ICD-10-CM | POA: Diagnosis not present

## 2016-05-05 DIAGNOSIS — Z881 Allergy status to other antibiotic agents status: Secondary | ICD-10-CM | POA: Diagnosis not present

## 2016-05-05 DIAGNOSIS — Z87891 Personal history of nicotine dependence: Secondary | ICD-10-CM | POA: Diagnosis not present

## 2016-05-05 DIAGNOSIS — M7661 Achilles tendinitis, right leg: Secondary | ICD-10-CM | POA: Diagnosis present

## 2016-05-05 DIAGNOSIS — J45909 Unspecified asthma, uncomplicated: Secondary | ICD-10-CM | POA: Insufficient documentation

## 2016-05-05 DIAGNOSIS — K219 Gastro-esophageal reflux disease without esophagitis: Secondary | ICD-10-CM | POA: Insufficient documentation

## 2016-05-05 HISTORY — PX: CALCANEAL OSTEOTOMY: SHX1281

## 2016-05-05 LAB — POCT PREGNANCY, URINE: PREG TEST UR: NEGATIVE

## 2016-05-05 LAB — GLUCOSE, CAPILLARY
GLUCOSE-CAPILLARY: 153 mg/dL — AB (ref 65–99)
GLUCOSE-CAPILLARY: 109 mg/dL — AB (ref 65–99)

## 2016-05-05 SURGERY — OSTEOTOMY, CALCANEUS
Anesthesia: Spinal | Site: Foot | Laterality: Right | Wound class: Clean

## 2016-05-05 MED ORDER — NEOMYCIN-POLYMYXIN B GU 40-200000 IR SOLN
Status: DC | PRN
  Administered 2016-05-05: 4 mL

## 2016-05-05 MED ORDER — MIDAZOLAM HCL 2 MG/2ML IJ SOLN
INTRAMUSCULAR | Status: DC | PRN
  Administered 2016-05-05: 2 mg via INTRAVENOUS

## 2016-05-05 MED ORDER — BUPIVACAINE HCL (PF) 0.5 % IJ SOLN
INTRAMUSCULAR | Status: DC | PRN
  Administered 2016-05-05: 20 mL

## 2016-05-05 MED ORDER — ONDANSETRON HCL 4 MG/2ML IJ SOLN: 4.0000 mg | Freq: Once | INTRAMUSCULAR | Status: DC | PRN

## 2016-05-05 MED ORDER — GABAPENTIN 400 MG PO CAPS
400.0000 mg | ORAL_CAPSULE | Freq: Once | ORAL | Status: AC
  Administered 2016-05-05: 400 mg via ORAL

## 2016-05-05 MED ORDER — SODIUM CHLORIDE 0.9 % IV SOLN
INTRAVENOUS | Status: DC
  Administered 2016-05-05 (×2): via INTRAVENOUS

## 2016-05-05 MED ORDER — CEFAZOLIN SODIUM-DEXTROSE 2-4 GM/100ML-% IV SOLN
INTRAVENOUS | Status: AC
  Filled 2016-05-05: qty 100

## 2016-05-05 MED ORDER — ONDANSETRON HCL 4 MG/2ML IJ SOLN
INTRAMUSCULAR | Status: DC | PRN
  Administered 2016-05-05: 4 mg via INTRAVENOUS

## 2016-05-05 MED ORDER — FENTANYL CITRATE (PF) 100 MCG/2ML IJ SOLN: 25.0000 ug | INTRAMUSCULAR | Status: DC | PRN

## 2016-05-05 MED ORDER — PROPOFOL 500 MG/50ML IV EMUL
INTRAVENOUS | Status: DC | PRN
  Administered 2016-05-05: 150 ug/kg/min via INTRAVENOUS

## 2016-05-05 MED ORDER — GABAPENTIN 400 MG PO CAPS
ORAL_CAPSULE | ORAL | Status: AC
  Administered 2016-05-05: 400 mg via ORAL
  Filled 2016-05-05: qty 1

## 2016-05-05 MED ORDER — PHENYLEPHRINE HCL 10 MG/ML IJ SOLN
INTRAMUSCULAR | Status: DC | PRN
  Administered 2016-05-05: 100 ug via INTRAVENOUS
  Administered 2016-05-05: 200 ug via INTRAVENOUS
  Administered 2016-05-05: 100 ug via INTRAVENOUS
  Administered 2016-05-05 (×2): 200 ug via INTRAVENOUS
  Administered 2016-05-05: 100 ug via INTRAVENOUS

## 2016-05-05 MED ORDER — BUPIVACAINE HCL (PF) 0.5 % IJ SOLN
INTRAMUSCULAR | Status: AC
Start: 2016-05-05 — End: 2016-05-05
  Filled 2016-05-05: qty 30

## 2016-05-05 MED ORDER — HYDROCODONE-ACETAMINOPHEN 7.5-325 MG PO TABS: 1.0000 | ORAL_TABLET | Freq: Four times a day (QID) | ORAL | Status: AC | PRN

## 2016-05-05 MED ORDER — CLINDAMYCIN PHOSPHATE 600 MG/50ML IV SOLN
600.0000 mg | Freq: Once | INTRAVENOUS | Status: AC
  Administered 2016-05-05: 600 mg via INTRAVENOUS

## 2016-05-05 MED ORDER — CEFAZOLIN SODIUM-DEXTROSE 2-4 GM/100ML-% IV SOLN
2.0000 g | INTRAVENOUS | Status: AC
  Administered 2016-05-05: 2 g via INTRAVENOUS

## 2016-05-05 MED ORDER — MELOXICAM 7.5 MG PO TABS
ORAL_TABLET | ORAL | Status: AC
  Administered 2016-05-05: 15 mg via ORAL
  Filled 2016-05-05: qty 2

## 2016-05-05 MED ORDER — SODIUM CHLORIDE 0.9 % IV BOLUS (SEPSIS)
500.0000 mL | Freq: Once | INTRAVENOUS | Status: AC
  Administered 2016-05-05: 500 mL via INTRAVENOUS

## 2016-05-05 MED ORDER — MELOXICAM 7.5 MG PO TABS
15.0000 mg | ORAL_TABLET | Freq: Once | ORAL | Status: AC
  Administered 2016-05-05: 15 mg via ORAL

## 2016-05-05 MED ORDER — NEOMYCIN-POLYMYXIN B GU 40-200000 IR SOLN
Status: AC
  Filled 2016-05-05: qty 4

## 2016-05-05 MED ORDER — CLINDAMYCIN PHOSPHATE 600 MG/50ML IV SOLN
INTRAVENOUS | Status: AC
  Filled 2016-05-05: qty 50

## 2016-05-05 SURGICAL SUPPLY — 52 items
BLADE OSCILLATING/SAGITTAL (BLADE) ×2
BLADE SURG MINI STRL (BLADE) ×3 IMPLANT
BLADE SW THK.38XMED LNG THN (BLADE) ×1 IMPLANT
BNDG COHESIVE 4X5 TAN STRL (GAUZE/BANDAGES/DRESSINGS) ×3 IMPLANT
BNDG ESMARK 6X12 TAN STRL LF (GAUZE/BANDAGES/DRESSINGS) ×3 IMPLANT
BUR SURG 4X8 MED (BURR) ×1 IMPLANT
BURR SURG 4MMX8MM MEDIUM (BURR) ×1
BURR SURG 4X8 MED (BURR) ×2
CANISTER SUCT 1200ML W/VALVE (MISCELLANEOUS) ×3 IMPLANT
CHLORAPREP W/TINT 26ML (MISCELLANEOUS) ×3 IMPLANT
CUFF TOURN 18 STER (MISCELLANEOUS) IMPLANT
CUFF TOURN 24 STER (MISCELLANEOUS) IMPLANT
CUFF TOURN 30 STER DUAL PORT (MISCELLANEOUS) ×3 IMPLANT
CUFF TOURN SGL QUICK 24 (TOURNIQUET CUFF)
CUFF TRNQT CYL 24X4X40X1 (TOURNIQUET CUFF) IMPLANT
DECANTER SPIKE VIAL GLASS SM (MISCELLANEOUS) ×3 IMPLANT
DRAIN PENROSE 1/4X12 LTX (DRAIN) IMPLANT
DRAPE FLUOR MINI C-ARM 54X84 (DRAPES) ×3 IMPLANT
ELECT REM PT RETURN 9FT ADLT (ELECTROSURGICAL) ×3
ELECTRODE REM PT RTRN 9FT ADLT (ELECTROSURGICAL) ×1 IMPLANT
GAUZE PETRO XEROFOAM 1X8 (MISCELLANEOUS) ×3 IMPLANT
GAUZE SPONGE 4X4 12PLY STRL (GAUZE/BANDAGES/DRESSINGS) ×3 IMPLANT
GLOVE BIO SURGEON STRL SZ8 (GLOVE) ×3 IMPLANT
GLOVE INDICATOR 8.0 STRL GRN (GLOVE) ×3 IMPLANT
GLOVE SURG ORTHO 8.5 STRL (GLOVE) ×3 IMPLANT
GOWN STRL REUS W/ TWL LRG LVL3 (GOWN DISPOSABLE) ×2 IMPLANT
GOWN STRL REUS W/TWL LRG LVL3 (GOWN DISPOSABLE) ×4
HANDLE YANKAUER SUCT BULB TIP (MISCELLANEOUS) ×3 IMPLANT
KIT RM TURNOVER STRD PROC AR (KITS) ×3 IMPLANT
LOOP RED MAXI  1X406MM (MISCELLANEOUS)
LOOP VESSEL MAXI 1X406 RED (MISCELLANEOUS) IMPLANT
LOOP VESSEL SUPERMAXI WHITE (MISCELLANEOUS) IMPLANT
NDL SAFETY 18GX1.5 (NEEDLE) ×3 IMPLANT
NEEDLE FILTER BLUNT 18X 1/2SAF (NEEDLE) ×2
NEEDLE FILTER BLUNT 18X1 1/2 (NEEDLE) ×1 IMPLANT
NS IRRIG 1000ML POUR BTL (IV SOLUTION) ×3 IMPLANT
PACK EXTREMITY ARMC (MISCELLANEOUS) ×3 IMPLANT
PADDING CAST 4IN STRL (MISCELLANEOUS) ×4
PADDING CAST BLEND 4X4 STRL (MISCELLANEOUS) ×2 IMPLANT
RASP SM TEAR CROSS CUT (RASP) ×3 IMPLANT
SPLINT CAST 1 STEP 4X30 (MISCELLANEOUS) IMPLANT
SPLINT CAST 1 STEP 5X30 WHT (MISCELLANEOUS) IMPLANT
SPONGE LAP 18X18 5 PK (GAUZE/BANDAGES/DRESSINGS) IMPLANT
STAPLER SKIN PROX 35W (STAPLE) ×3 IMPLANT
STOCKINETTE BIAS CUT 6 980064 (GAUZE/BANDAGES/DRESSINGS) ×3 IMPLANT
STOCKINETTE IMPERVIOUS 9X36 MD (GAUZE/BANDAGES/DRESSINGS) ×3 IMPLANT
SUT BONE WAX W31G (SUTURE) ×3 IMPLANT
SUT VIC AB 2-0 CT2 27 (SUTURE) ×3 IMPLANT
SUT VIC AB 3-0 SH 27 (SUTURE) ×2
SUT VIC AB 3-0 SH 27X BRD (SUTURE) ×1 IMPLANT
SYR 30ML LL (SYRINGE) ×3 IMPLANT
SYRINGE 10CC LL (SYRINGE) ×3 IMPLANT

## 2016-05-05 NOTE — Anesthesia Preprocedure Evaluation (Addendum)
Anesthesia Evaluation  Patient identified by MRN, date of birth, ID band Patient awake    Reviewed: Allergy & Precautions, NPO status , Patient's Chart, lab work & pertinent test results  Airway Mallampati: III  TM Distance: >3 FB Neck ROM: Full    Dental  (+) Chipped   Pulmonary asthma , former smoker,    Pulmonary exam normal        Cardiovascular hypertension, Pt. on medications Normal cardiovascular exam     Neuro/Psych Depression Neuropathy of left foot  Neuromuscular disease    GI/Hepatic GERD  Medicated and Controlled,  Endo/Other  diabetes, Type 2, Oral Hypoglycemic Agents  Renal/GU   negative genitourinary   Musculoskeletal  (+) Arthritis , Osteoarthritis,    Abdominal (+) + obese,   Peds negative pediatric ROS (+)  Hematology  (+) anemia ,   Anesthesia Other Findings   Reproductive/Obstetrics                            Anesthesia Physical Anesthesia Plan  ASA: III  Anesthesia Plan: Spinal   Post-op Pain Management:    Induction: Intravenous  Airway Management Planned: Nasal Cannula  Additional Equipment:   Intra-op Plan:   Post-operative Plan:   Informed Consent: I have reviewed the patients History and Physical, chart, labs and discussed the procedure including the risks, benefits and alternatives for the proposed anesthesia with the patient or authorized representative who has indicated his/her understanding and acceptance.   Dental advisory given  Plan Discussed with: CRNA and Surgeon  Anesthesia Plan Comments:         Anesthesia Quick Evaluation

## 2016-05-05 NOTE — Op Note (Signed)
05/05/2016  9:04 AM  PATIENT:  Virginia [name]    PRE-OPERATIVE DIAGNOSIS:  M76.61 Achilles tendinitis, right leg  POST-OPERATIVE DIAGNOSIS:  Same  PROCEDURE:  CALCANEAL OSTEOTOMY RIGHT LEG  SURGEON:  Park Breed, MD  ANESTHESIA:   General  PREOPERATIVE INDICATIONS:  Virginia [name] is a  45 y.o. female with a diagnosis of M76.61 Achilles tendinitis, right leg who failed conservative measures and elected for surgical management.    The risks benefits and alternatives were discussed with the patient preoperatively including but not limited to the risks of infection, bleeding, nerve injury, cardiopulmonary complications, the need for revision surgery, among others, and the patient was willing to proceed.  EBL: None   TOURNIQUET TIME: 40 min   OPERATIVE IMPLANTS: None  OPERATIVE FINDINGS: Very large Haglund's deformity  OPERATIVE PROCEDURE: The patient was brought to the operating room and underwent satisfactory general endotracheal anesthesia. The patient was then rolled into the prone position on the operating room table and padded appropriately.  The operative leg was prepped and draped sterile fashion.  Esmarch was applied and tourniquet inflated to 300 mmHg.  A posterior lateral incision was made parallel to the distal portion of the Achilles tendon.  Dissection was carried out bluntly through subcutaneous tissue.  The sural nerve was protected.  The retrocalcaneal  bursa and adjacent tissues were debrided to expose the dorsal aspect of the calcaneus posteriorly.  The interval between the Achilles tendon and the large Haglund deformity was exposed and retracted with a Crego retractor.  An oscillating saw was then used to remove the large bony prominence.  A bur was used to remove posterior bone as well.  A motorized rasp was used to smooth this off.  Fluoroscopy showed good removal of the offending bone.  The traction spur at the distal Achilles insertion point was left alone.   After thorough irrigation bone wax was applied to the raw bony surface.  The soft tissues were closed with 2-0 and 3-0 Vicryls and the skin was closed with staples.  Half percent Marcaine was placed in the soft tissues.  Dry sterile dressing with a posterior splint was applied with the ankle in some equinus.  Sponge and needle counts were correct.  The patient was then rolled into the supine position on a stretcher and awakened.  Return to the recovery room in good condition.  Park Breed, MD

## 2016-05-05 NOTE — Transfer of Care (Signed)
Immediate Anesthesia Transfer of Care Note  Patient: Virginia [name]  Procedure(s) Performed: Procedure(s): CALCANEAL OSTEOTOMY (Right)  Patient Location: PACU  Anesthesia Type:Spinal  Level of Consciousness: awake, alert , oriented and patient cooperative  Airway & Oxygen Therapy: Patient Spontanous Breathing and Patient connected to face mask oxygen  Post-op Assessment: Report given to RN, Post -op Vital signs reviewed and stable and Patient moving all extremities X 4  Post vital signs: Reviewed and stable  Last Vitals:  Filed Vitals:   05/05/16 0615  BP: 116/75  Pulse: 92  Temp: 36.1 C  Resp: 18    Last Pain: There were no vitals filed for this visit.       Complications: No apparent anesthesia complications

## 2016-05-05 NOTE — OR Nursing (Signed)
Crutch training instructions given to pt. Pt already used crutches before.

## 2016-05-05 NOTE — Progress Notes (Signed)
Dr Kayleen Memos called about blood pressure 88/48  No new orders

## 2016-05-05 NOTE — H&P (Signed)
THE PATIENT WAS SEEN PRIOR TO SURGERY TODAY.  HISTORY, ALLERGIES, HOME MEDICATIONS AND OPERATIVE PROCEDURE WERE REVIEWED. RISKS AND BENEFITS OF SURGERY DISCUSSED WITH PATIENT AGAIN.  NO CHANGES FROM INITIAL HISTORY AND PHYSICAL NOTED.    

## 2016-05-05 NOTE — Anesthesia Procedure Notes (Signed)
Spinal Patient location during procedure: OR Start time: 05/05/2016 7:45 AM End time: 05/05/2016 7:50 AM Staffing Anesthesiologist: Alvin Critchley Performed by: anesthesiologist  Preanesthetic Checklist Completed: patient identified, site marked, surgical consent, pre-op evaluation, timeout performed, IV checked, risks and benefits discussed and monitors and equipment checked Spinal Block Patient position: sitting Prep: Betadine and site prepped and draped Patient monitoring: heart rate, cardiac monitor, continuous pulse ox and blood pressure Approach: midline Location: L3-4 Injection technique: single-shot Needle Needle type: Whitacre  Needle gauge: 25 G Needle length: 9 cm Needle insertion depth: 7 cm Assessment Sensory level: T8 Additional Notes Time out called.  Patient placed in sitting position.  Prepped and draped in sterile fashion.  A skin wheal was made at the L3-L4 interspace with 1% Lidocaine plain.  A 25 G WH needle was guided with the return of clear, colorless CSF in all 4 quadrants.  No blood or paresthesias.  Patient tolerated the procedure well.  15 mg of Marcaine with 1: 200K epi was injected.

## 2016-05-05 NOTE — Progress Notes (Signed)
Elevated leg on pillows and applied ice pack to right heel

## 2016-05-05 NOTE — Progress Notes (Signed)
Can almost rise bottom off bed

## 2016-05-05 NOTE — Progress Notes (Signed)
Bolus given for low blood pressure per dr Kayleen Memos order

## 2016-05-06 NOTE — Anesthesia Postprocedure Evaluation (Signed)
Anesthesia Post Note  Patient: Virginia [name]  Procedure(s) Performed: Procedure(s) (LRB): CALCANEAL OSTEOTOMY (Right)  Patient location during evaluation: PACU Anesthesia Type: Spinal Level of consciousness: awake and alert and oriented Pain management: pain level controlled Vital Signs Assessment: post-procedure vital signs reviewed and stable Cardiovascular status: blood pressure returned to baseline Anesthetic complications: no    Last Vitals:  Filed Vitals:   05/05/16 1103 05/05/16 1147  BP: 100/47 106/54  Pulse: 64 66  Temp:    Resp: 16 16    Last Pain:  Filed Vitals:   05/06/16 0836  PainSc: 2                  Diann Bangerter

## 2016-05-09 DIAGNOSIS — Z01818 Encounter for other preprocedural examination: Secondary | ICD-10-CM | POA: Insufficient documentation

## 2016-05-09 DIAGNOSIS — M7660 Achilles tendinitis, unspecified leg: Secondary | ICD-10-CM | POA: Insufficient documentation

## 2016-05-09 NOTE — Assessment & Plan Note (Signed)
Couple of pounds of weight loss, acknowledged

## 2016-05-09 NOTE — Assessment & Plan Note (Signed)
Patient cleared for upcoming foot/ankle surgery

## 2016-08-02 ENCOUNTER — Ambulatory Visit: Payer: 59 | Admitting: Family Medicine

## 2016-09-21 ENCOUNTER — Other Ambulatory Visit: Payer: Self-pay | Admitting: Internal Medicine

## 2016-09-21 DIAGNOSIS — Z1239 Encounter for other screening for malignant neoplasm of breast: Secondary | ICD-10-CM

## 2016-09-24 ENCOUNTER — Telehealth: Payer: Self-pay | Admitting: Family Medicine

## 2016-09-27 NOTE — Telephone Encounter (Signed)
Spoke with patient and she stated that her [employer] is no longer in our network so she is needing to find a different provider.

## 2016-09-27 NOTE — Telephone Encounter (Signed)
Please ask pt to get pain medicine from ortho We missed her in Sept for her diabetes visit, so please ask her to schedule an appt, first available, for fasting labs and visit Thank you

## 2016-10-28 ENCOUNTER — Ambulatory Visit
Admission: RE | Admit: 2016-10-28 | Discharge: 2016-10-28 | Disposition: A | Discharge status: Home / Self Care | Payer: 59 | Source: Ambulatory Visit | Attending: Internal Medicine | Admitting: Internal Medicine

## 2016-10-28 DIAGNOSIS — R928 Other abnormal and inconclusive findings on diagnostic imaging of breast: Secondary | ICD-10-CM | POA: Diagnosis not present

## 2016-10-28 DIAGNOSIS — Z1239 Encounter for other screening for malignant neoplasm of breast: Secondary | ICD-10-CM

## 2016-10-28 DIAGNOSIS — Z1231 Encounter for screening mammogram for malignant neoplasm of breast: Secondary | ICD-10-CM | POA: Diagnosis not present

## 2016-11-01 ENCOUNTER — Other Ambulatory Visit: Payer: Self-pay | Admitting: Internal Medicine

## 2016-11-01 DIAGNOSIS — N6310 Unspecified lump in the right breast, unspecified quadrant: Secondary | ICD-10-CM

## 2016-11-01 DIAGNOSIS — R928 Other abnormal and inconclusive findings on diagnostic imaging of breast: Secondary | ICD-10-CM

## 2016-11-18 ENCOUNTER — Ambulatory Visit
Admission: RE | Admit: 2016-11-18 | Discharge: 2016-11-18 | Disposition: A | Discharge status: Home / Self Care | Payer: 59 | Source: Ambulatory Visit | Attending: Internal Medicine | Admitting: Internal Medicine

## 2016-11-18 DIAGNOSIS — N6310 Unspecified lump in the right breast, unspecified quadrant: Secondary | ICD-10-CM

## 2016-11-18 DIAGNOSIS — R928 Other abnormal and inconclusive findings on diagnostic imaging of breast: Secondary | ICD-10-CM

## 2016-12-13 DIAGNOSIS — R809 Proteinuria, unspecified: Secondary | ICD-10-CM | POA: Diagnosis not present

## 2016-12-13 DIAGNOSIS — R3129 Other microscopic hematuria: Secondary | ICD-10-CM | POA: Diagnosis not present

## 2016-12-13 DIAGNOSIS — E1129 Type 2 diabetes mellitus with other diabetic kidney complication: Secondary | ICD-10-CM | POA: Diagnosis not present

## 2016-12-20 DIAGNOSIS — Z0000 Encounter for general adult medical examination without abnormal findings: Secondary | ICD-10-CM | POA: Diagnosis not present

## 2017-01-23 IMAGING — US US BREAST*R* LIMITED INC AXILLA
1 series · 10 of 10 positions shown · non-contrast
Comparison: Baseline screening mammogram dated 10/28/2016.

CLINICAL DATA: Patient returns today to evaluate possible masses
within the lower and outer right breast identified on recent
baseline screening mammogram.

EXAM:
2D DIGITAL DIAGNOSTIC RIGHT MAMMOGRAM WITH CAD AND ADJUNCT TOMO
ULTRASOUND RIGHT BREAST

[Series 1: us breast*right* limited inc axilla · 0.06mm/px · 10 of 10 slices shown]
[im 1/10]
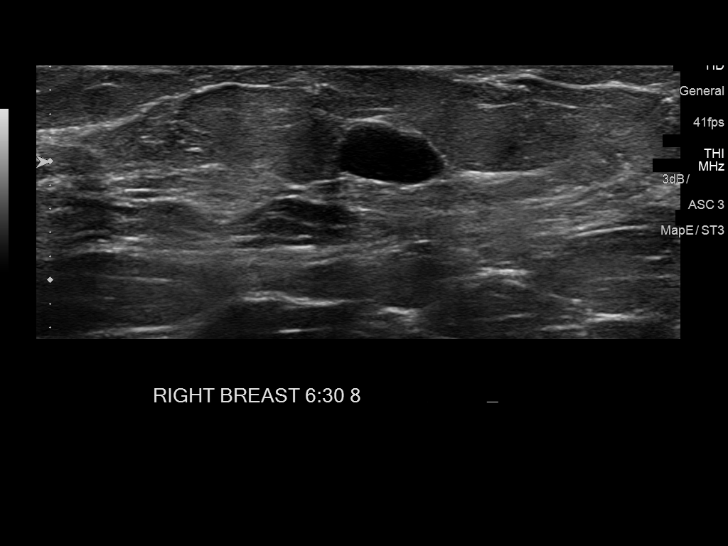
[im 2/10]
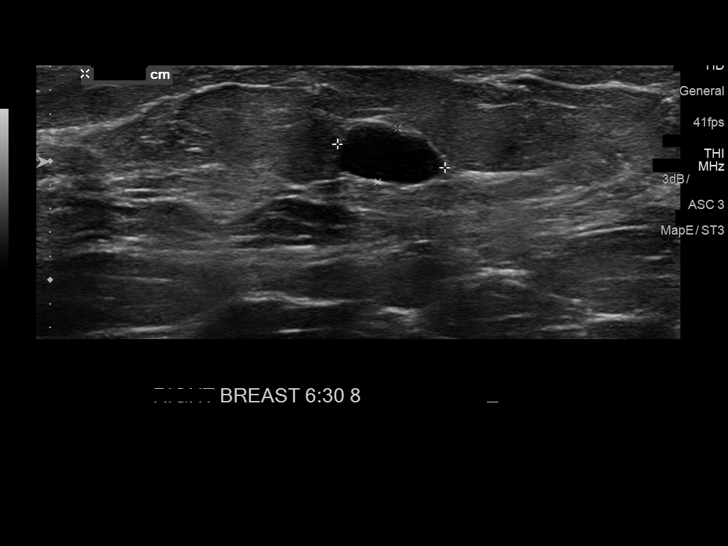
[im 3/10]
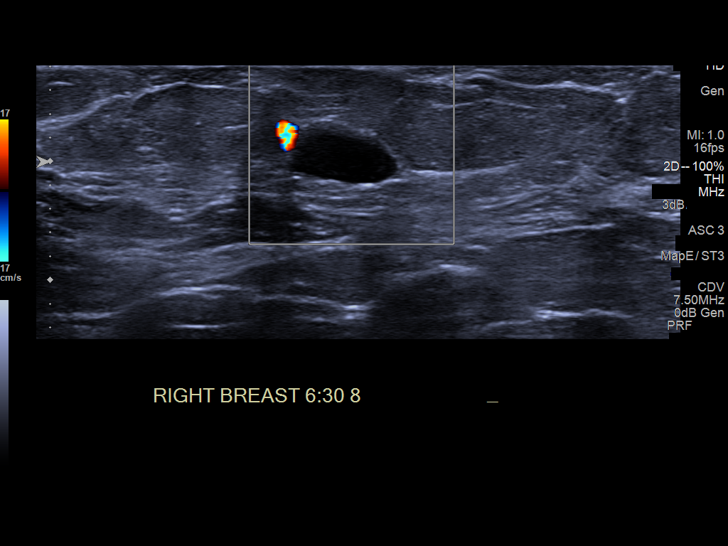
[im 4/10]
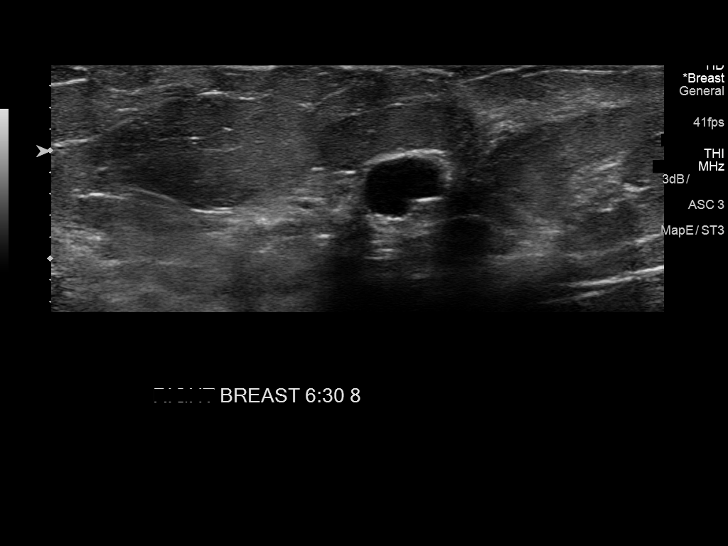
[im 5/10]
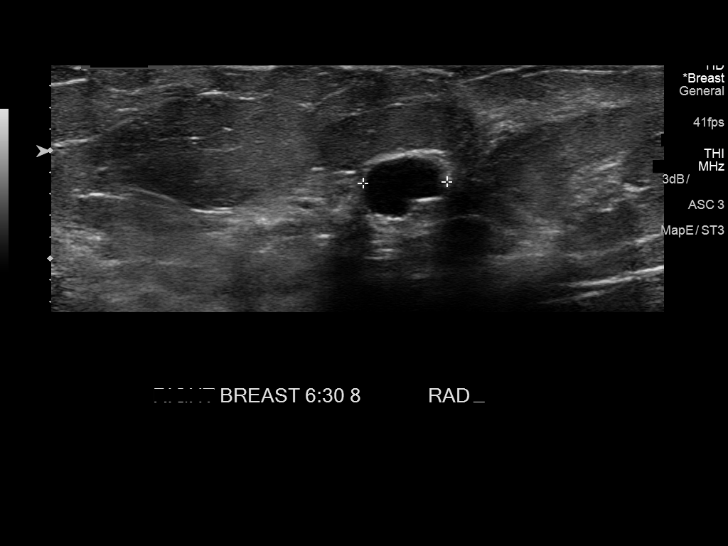
[im 6/10]
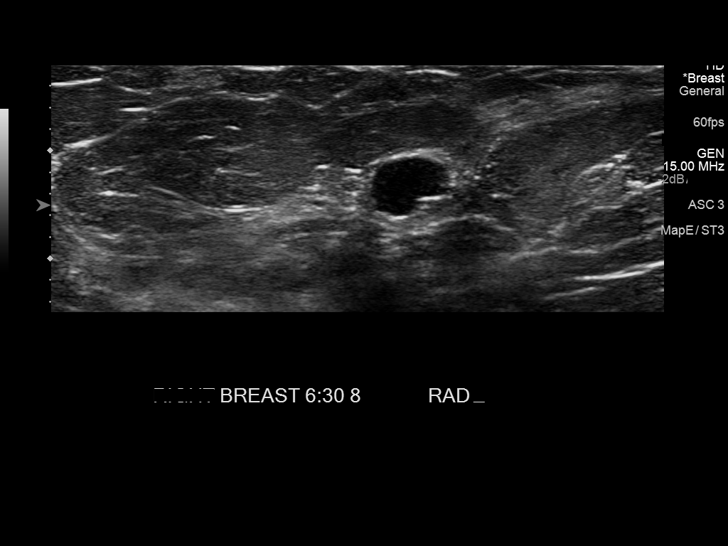
[im 7/10]
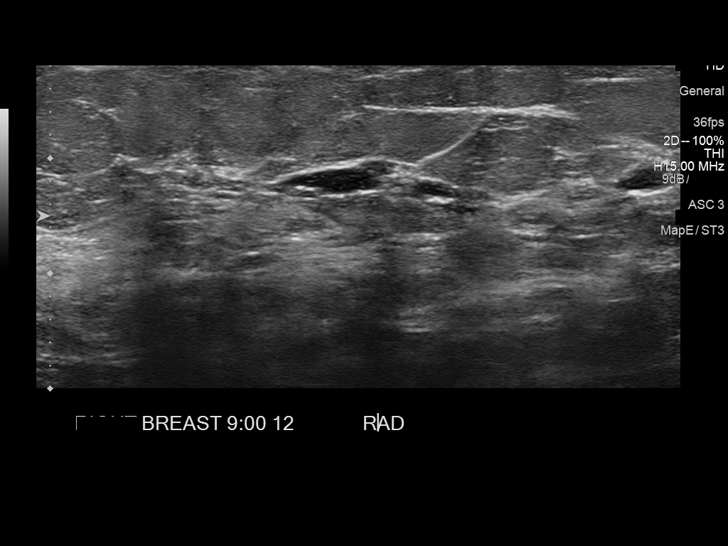
[im 8/10]
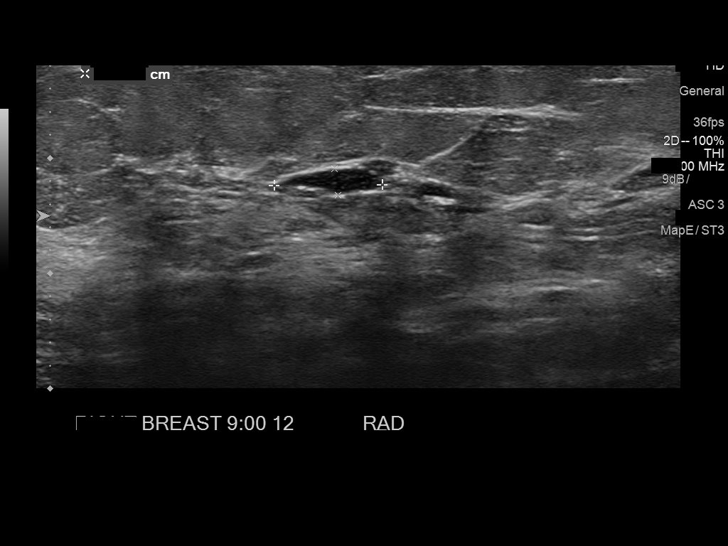
[im 9/10]
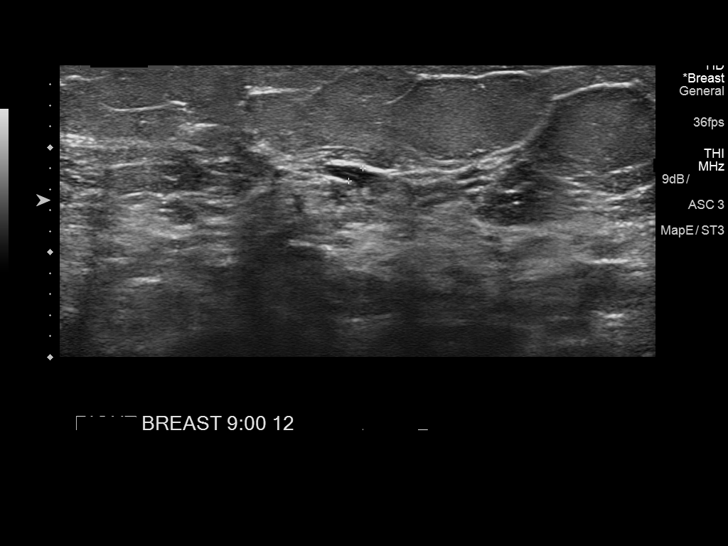
[im 10/10]
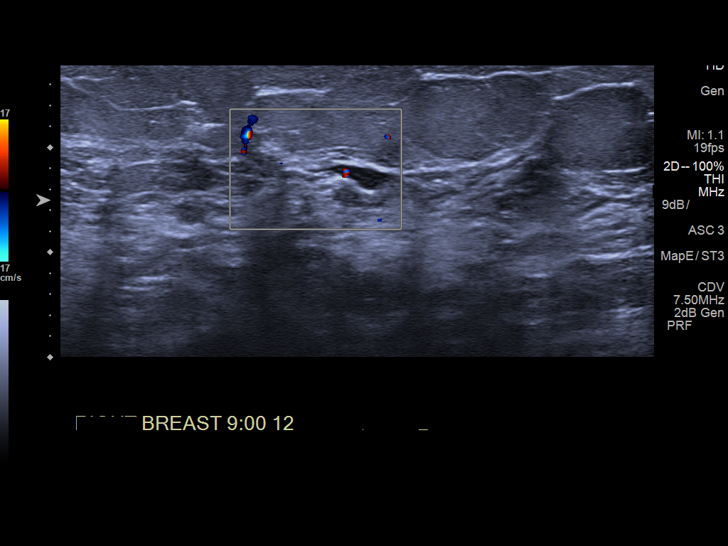

[10 of 10 positions shown; findings below may reference images not displayed]

ACR Breast Density Category b: There are scattered areas of
fibroglandular density.
FINDINGS: On today's additional views with spot compression and 3D
tomosynthesis, an oval circumscribed mass is confirmed within the
lower right breast, 6-7 o'clock axis region, at middle depth,
measuring 9-10 mm greatest dimension.

An additional oval circumscribed mass is confirmed within the outer
right breast, 9 o'clock axis region, at posterior depth, consistent
with benign intramammary lymph node.

Mammographic images were processed with CAD.

Targeted ultrasound is performed, showing a benign simple cyst
within the right breast at the 6:30 o'clock axis, 8 cm from nipple,
measuring 9 x 4 x 8 mm, corresponding to the mammographic finding.
Benign intramammary lymph node is also confirmed within the outer
right breast, 9 o'clock axis, 12 cm from the nipple, corresponding
to the mammographic finding.
IMPRESSION: No evidence of malignancy within the right breast. Benign cyst at
the 6 o'clock axis and benign intramammary lymph node at the 9
o'clock axis.

Patient may return to routine annual bilateral screening mammogram
schedule.

RECOMMENDATION:
Screening mammogram in one year.(Code:RD-I-CRC)

I have discussed the findings and recommendations with the patient.
Results were also provided in writing at the conclusion of the
visit. If applicable, a reminder letter will be sent to the patient
regarding the next appointment.

BI-RADS CATEGORY  2: Benign.

## 2017-06-26 IMAGING — MG MM DIGITAL DIAGNOSTIC UNILAT*R* W/ TOMO W/ CAD
8 of 12 series · 8 of 28 positions shown · non-contrast
Comparison: Baseline screening mammogram dated 10/28/2016.

CLINICAL DATA: Patient returns today to evaluate possible masses
within the lower and outer right breast identified on recent
baseline screening mammogram.

EXAM:
2D DIGITAL DIAGNOSTIC RIGHT MAMMOGRAM WITH CAD AND ADJUNCT TOMO
ULTRASOUND RIGHT BREAST

[R CC (1 of 2)]
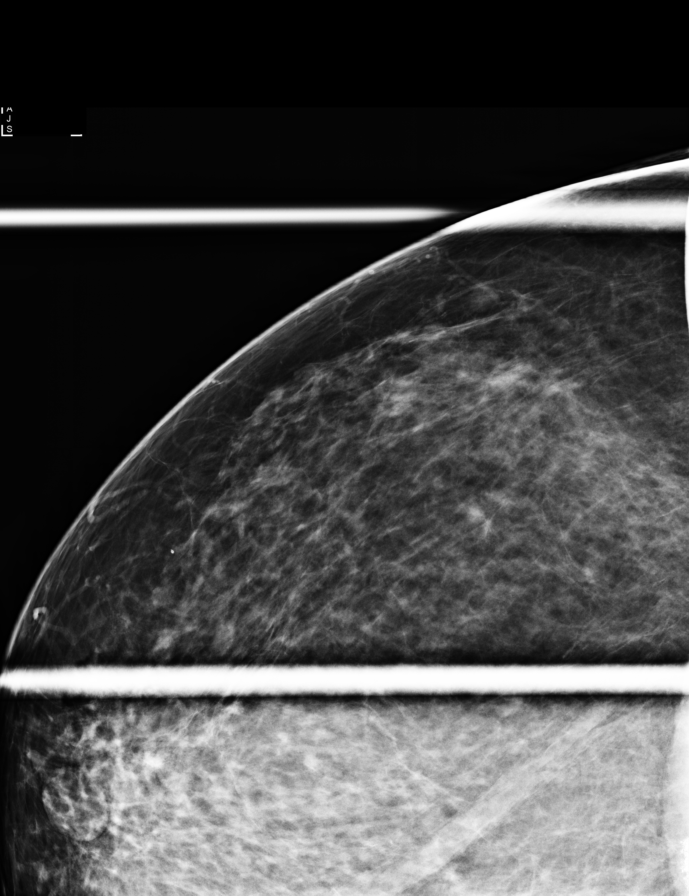

[R CC (2 of 2)]
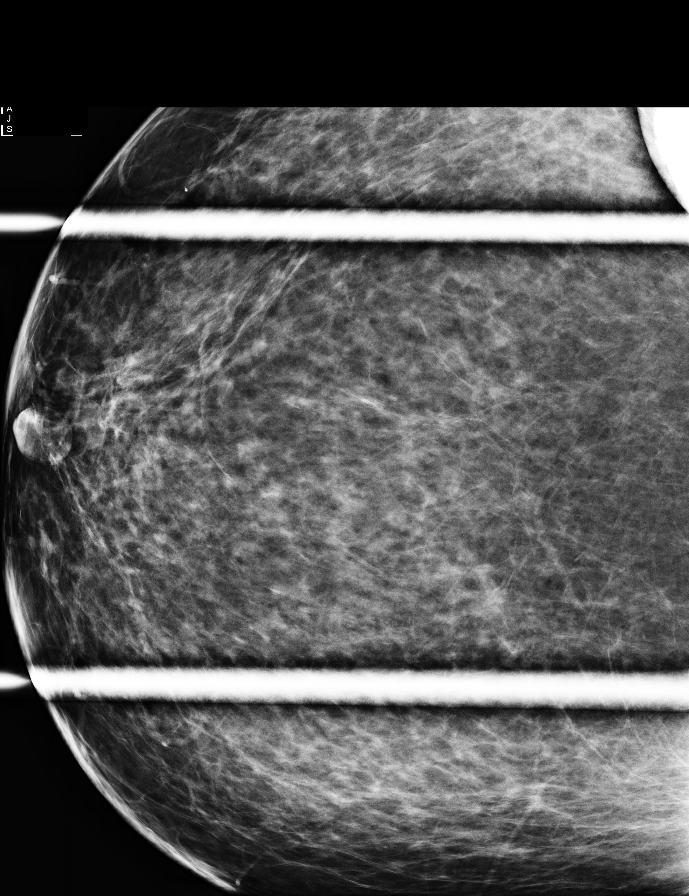

[R CC synth-2D (1 of 2)]
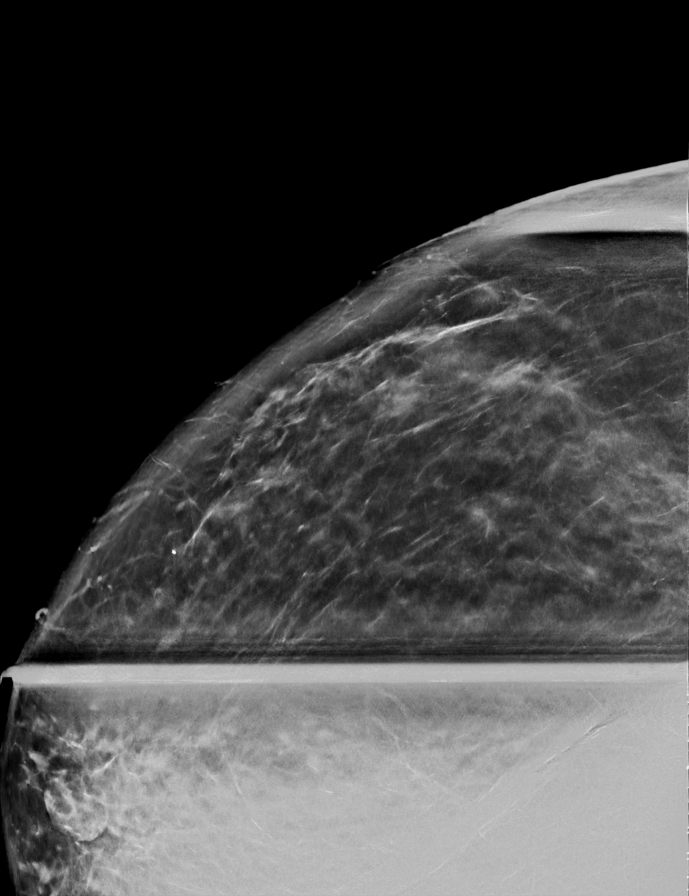

[R MLO]
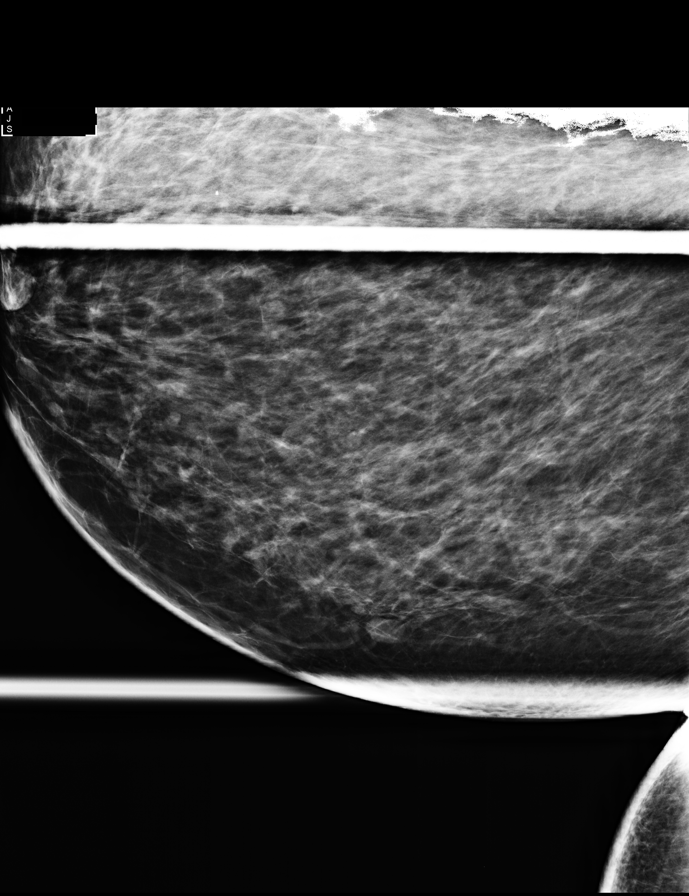

[R ML]
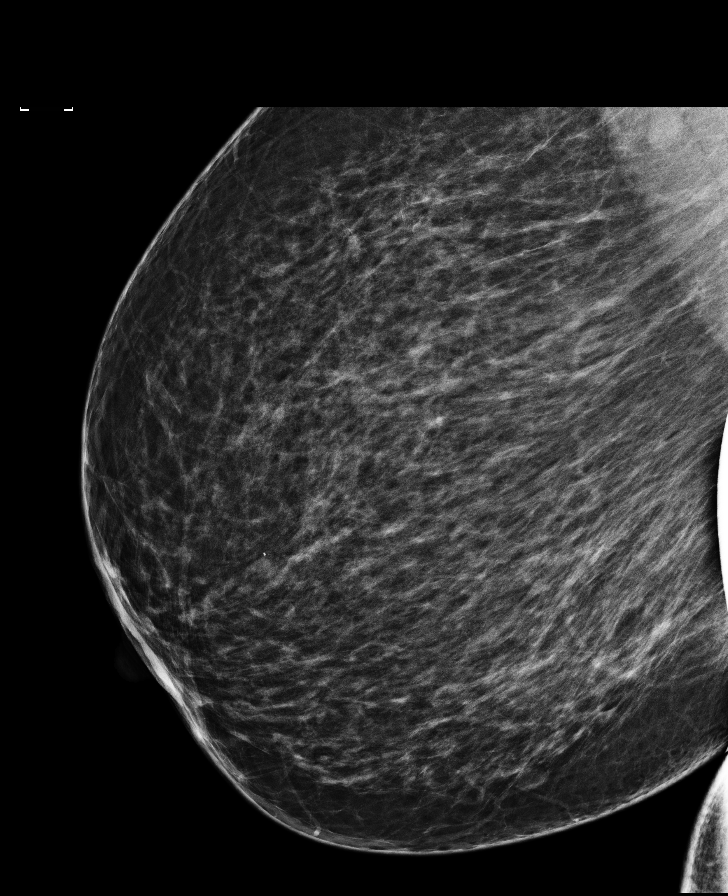

[R ML synth-2D]
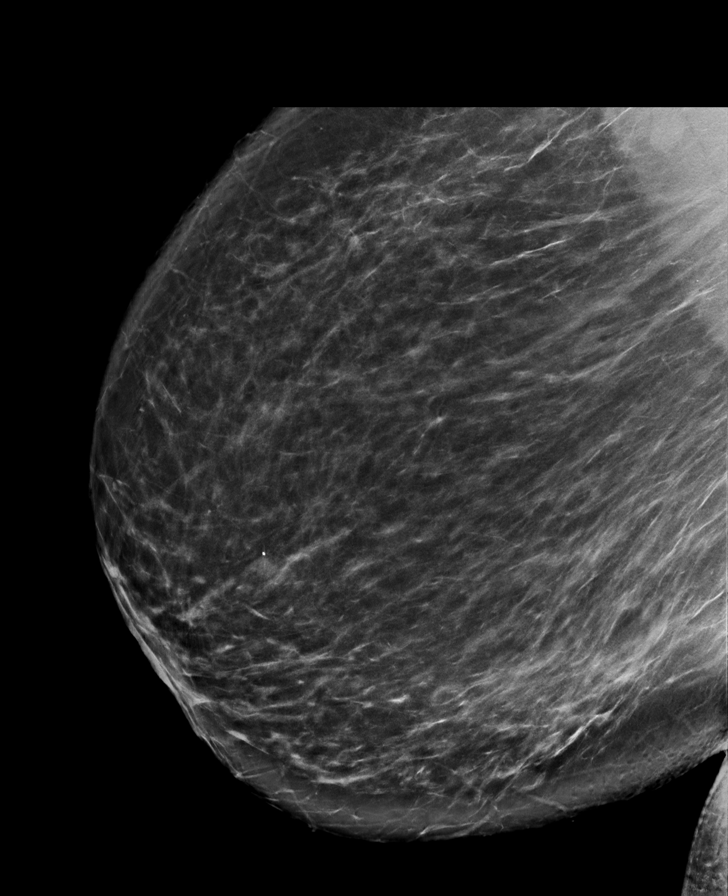

[R CC synth-2D (2 of 2)]
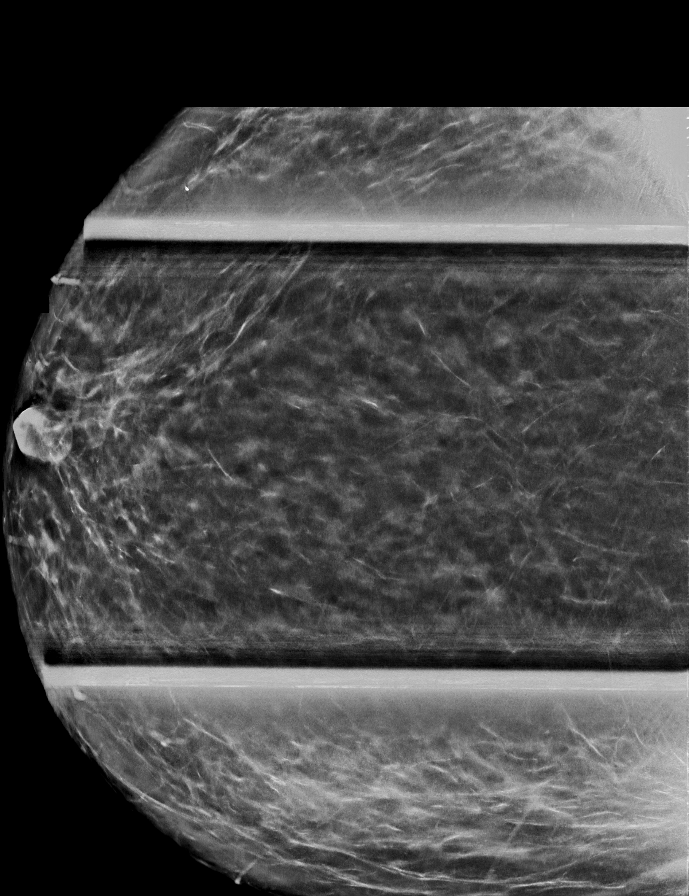

[R MLO synth-2D]
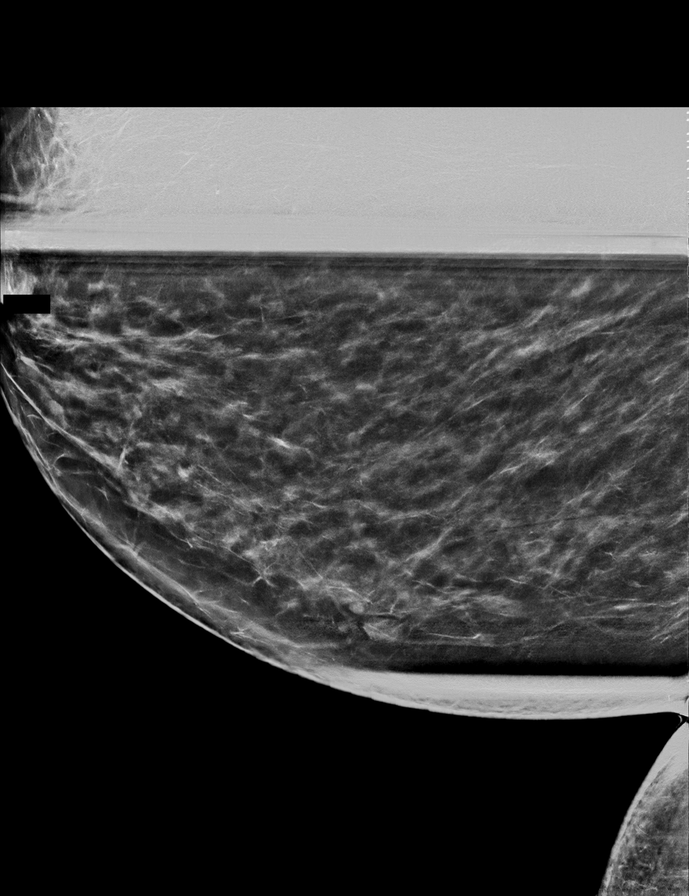

[8 of 28 positions shown; findings below may reference images not displayed]

ACR Breast Density Category b: There are scattered areas of
fibroglandular density.
FINDINGS: On today's additional views with spot compression and 3D
tomosynthesis, an oval circumscribed mass is confirmed within the
lower right breast, 6-7 o'clock axis region, at middle depth,
measuring 9-10 mm greatest dimension.

An additional oval circumscribed mass is confirmed within the outer
right breast, 9 o'clock axis region, at posterior depth, consistent
with benign intramammary lymph node.

Mammographic images were processed with CAD.

Targeted ultrasound is performed, showing a benign simple cyst
within the right breast at the 6:30 o'clock axis, 8 cm from nipple,
measuring 9 x 4 x 8 mm, corresponding to the mammographic finding.
Benign intramammary lymph node is also confirmed within the outer
right breast, 9 o'clock axis, 12 cm from the nipple, corresponding
to the mammographic finding.
IMPRESSION: No evidence of malignancy within the right breast. Benign cyst at
the 6 o'clock axis and benign intramammary lymph node at the 9
o'clock axis.

Patient may return to routine annual bilateral screening mammogram
schedule.

RECOMMENDATION:
Screening mammogram in one year.(Code:RD-I-CRC)

I have discussed the findings and recommendations with the patient.
Results were also provided in writing at the conclusion of the
visit. If applicable, a reminder letter will be sent to the patient
regarding the next appointment.

BI-RADS CATEGORY  2: Benign.

## 2017-07-07 DIAGNOSIS — E1129 Type 2 diabetes mellitus with other diabetic kidney complication: Secondary | ICD-10-CM | POA: Diagnosis not present

## 2017-07-07 DIAGNOSIS — R42 Dizziness and giddiness: Secondary | ICD-10-CM | POA: Diagnosis not present

## 2017-07-07 DIAGNOSIS — I10 Essential (primary) hypertension: Secondary | ICD-10-CM | POA: Diagnosis not present

## 2017-10-26 ENCOUNTER — Other Ambulatory Visit: Payer: Self-pay | Admitting: Internal Medicine

## 2017-10-26 DIAGNOSIS — Z1231 Encounter for screening mammogram for malignant neoplasm of breast: Secondary | ICD-10-CM

## 2017-11-18 DIAGNOSIS — E119 Type 2 diabetes mellitus without complications: Secondary | ICD-10-CM | POA: Diagnosis not present

## 2017-11-18 DIAGNOSIS — I10 Essential (primary) hypertension: Secondary | ICD-10-CM | POA: Diagnosis not present

## 2017-11-21 ENCOUNTER — Ambulatory Visit
Admission: RE | Admit: 2017-11-21 | Discharge: 2017-11-21 | Disposition: A | Discharge status: Home / Self Care | Payer: 59 | Source: Ambulatory Visit | Attending: Internal Medicine | Admitting: Internal Medicine

## 2017-11-21 DIAGNOSIS — Z1231 Encounter for screening mammogram for malignant neoplasm of breast: Secondary | ICD-10-CM

## 2018-01-10 DIAGNOSIS — I10 Essential (primary) hypertension: Secondary | ICD-10-CM | POA: Diagnosis not present

## 2018-01-10 DIAGNOSIS — E119 Type 2 diabetes mellitus without complications: Secondary | ICD-10-CM | POA: Diagnosis not present

## 2018-02-17 DIAGNOSIS — E1129 Type 2 diabetes mellitus with other diabetic kidney complication: Secondary | ICD-10-CM | POA: Diagnosis not present

## 2018-02-17 DIAGNOSIS — Z0000 Encounter for general adult medical examination without abnormal findings: Secondary | ICD-10-CM | POA: Diagnosis not present

## 2018-02-17 DIAGNOSIS — I10 Essential (primary) hypertension: Secondary | ICD-10-CM | POA: Diagnosis not present

## 2018-02-22 DIAGNOSIS — J09X2 Influenza due to identified novel influenza A virus with other respiratory manifestations: Secondary | ICD-10-CM | POA: Diagnosis not present

## 2018-05-16 DIAGNOSIS — R809 Proteinuria, unspecified: Secondary | ICD-10-CM | POA: Diagnosis not present

## 2018-05-16 DIAGNOSIS — E1129 Type 2 diabetes mellitus with other diabetic kidney complication: Secondary | ICD-10-CM | POA: Diagnosis not present

## 2018-05-23 DIAGNOSIS — E1129 Type 2 diabetes mellitus with other diabetic kidney complication: Secondary | ICD-10-CM | POA: Diagnosis not present

## 2018-05-23 DIAGNOSIS — E7800 Pure hypercholesterolemia, unspecified: Secondary | ICD-10-CM | POA: Diagnosis not present

## 2018-05-23 DIAGNOSIS — I10 Essential (primary) hypertension: Secondary | ICD-10-CM | POA: Diagnosis not present

## 2018-08-17 DIAGNOSIS — E1129 Type 2 diabetes mellitus with other diabetic kidney complication: Secondary | ICD-10-CM | POA: Diagnosis not present

## 2018-08-17 DIAGNOSIS — J0100 Acute maxillary sinusitis, unspecified: Secondary | ICD-10-CM | POA: Diagnosis not present

## 2018-08-17 DIAGNOSIS — E7800 Pure hypercholesterolemia, unspecified: Secondary | ICD-10-CM | POA: Diagnosis not present

## 2018-08-17 DIAGNOSIS — R809 Proteinuria, unspecified: Secondary | ICD-10-CM | POA: Diagnosis not present

## 2018-08-24 DIAGNOSIS — J209 Acute bronchitis, unspecified: Secondary | ICD-10-CM | POA: Diagnosis not present

## 2018-08-24 DIAGNOSIS — Z23 Encounter for immunization: Secondary | ICD-10-CM | POA: Diagnosis not present

## 2018-08-24 DIAGNOSIS — E1129 Type 2 diabetes mellitus with other diabetic kidney complication: Secondary | ICD-10-CM | POA: Diagnosis not present

## 2018-08-24 DIAGNOSIS — E7800 Pure hypercholesterolemia, unspecified: Secondary | ICD-10-CM | POA: Diagnosis not present

## 2018-09-15 ENCOUNTER — Other Ambulatory Visit: Payer: Self-pay

## 2018-09-15 ENCOUNTER — Encounter (HOSPITAL_COMMUNITY): Payer: Self-pay

## 2018-09-15 ENCOUNTER — Emergency Department (HOSPITAL_COMMUNITY)
Admission: EM | Admit: 2018-09-15 | Discharge: 2018-09-15 | Disposition: A | Discharge status: Home / Self Care | Payer: 59 | Attending: Emergency Medicine | Admitting: Emergency Medicine

## 2018-09-15 DIAGNOSIS — I10 Essential (primary) hypertension: Secondary | ICD-10-CM | POA: Diagnosis not present

## 2018-09-15 DIAGNOSIS — M62830 Muscle spasm of back: Secondary | ICD-10-CM | POA: Diagnosis not present

## 2018-09-15 DIAGNOSIS — E119 Type 2 diabetes mellitus without complications: Secondary | ICD-10-CM | POA: Insufficient documentation

## 2018-09-15 DIAGNOSIS — Z87891 Personal history of nicotine dependence: Secondary | ICD-10-CM | POA: Diagnosis not present

## 2018-09-15 DIAGNOSIS — Z79899 Other long term (current) drug therapy: Secondary | ICD-10-CM | POA: Diagnosis not present

## 2018-09-15 DIAGNOSIS — M546 Pain in thoracic spine: Secondary | ICD-10-CM | POA: Diagnosis present

## 2018-09-15 MED ORDER — METHOCARBAMOL 500 MG PO TABS: 1000.0000 mg | ORAL_TABLET | Freq: Four times a day (QID) | ORAL | 0 refills | Status: AC

## 2018-09-15 MED ORDER — METHOCARBAMOL 500 MG PO TABS
1000.0000 mg | ORAL_TABLET | Freq: Once | ORAL | Status: AC
Start: 2018-09-15 — End: 2018-09-15
  Administered 2018-09-15: 1000 mg via ORAL
  Filled 2018-09-15: qty 2

## 2018-09-15 NOTE — ED Provider Notes (Signed)
[facility] DEPT Provider Note   CSN: [id number] Arrival date & time: 09/15/18  [place]     History   Chief Complaint Chief Complaint  Patient presents with  . Back Pain    HPI Virginia [name] is a 47 y.o. female.  Patient presents emergency department with complaint of right-sided muscle spasms in her back that have been ongoing over the past 3 days or so.  Pain is worse with movement, certain positions, and palpation.  They are intermittent.  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  She has been taking ibuprofen and she took 1 of her husbands muscle pills. Patient denies warning symptoms of back pain including: fecal incontinence, urinary retention or overflow incontinence, night sweats, waking from sleep with back pain, unexplained fevers or weight loss, h/o cancer, IVDU, recent trauma.        Past Medical History:  Diagnosis Date  . AC (acromioclavicular) joint bone spurs   . Acid reflux 04/15/2016  . Arthritis   . Asthma   . Calcium oxalate crystals in urine 04/30/2016  . Depression    history of   . Diabetes mellitus without complication ([place])   . Hypertension   . Neuropathy of left foot 04/15/2016  . Obesity (BMI 30-39.9) 06/01/2011  . Plantar fasciitis of right foot   . Recent urinary tract infection 04/15/2016    Patient Active Problem List   Diagnosis Date Noted  . Achilles tendonitis 05/09/2016  . Pre-operative exam 05/09/2016  . Proteinuria 05/04/2016  . Hematuria, microscopic 05/04/2016  . Screening for HIV (human immunodeficiency virus) 04/30/2016  . Calcium oxalate crystals in urine 04/30/2016  . Glucosuria 04/15/2016  . Recent urinary tract infection 04/15/2016  . Medication monitoring encounter 04/15/2016  . Chronic pain of right heel 04/15/2016  . Acid reflux 04/15/2016  . Neuropathy of left foot 04/15/2016  . Hypertension goal BP (blood pressure) < 140/90 09/16/2015  . Asthma, mild intermittent, well-controlled  09/16/2015  . Insomnia, uncontrolled 09/16/2015  . Ankle pain 06/01/2011  . Uncontrolled diabetes mellitus type 2 without complications ([place]) 12/45/8099  . Dermatitis, eczematoid 06/01/2011  . HLD (hyperlipidemia) 06/01/2011  . Microcytic anemia 06/01/2011  . Obesity (BMI 30-39.9) 06/01/2011  . Bilateral polycystic ovarian syndrome 06/01/2011  . Abnormal Pap smear of cervix 06/01/2011    Past Surgical History:  Procedure Laterality Date  . CALCANEAL OSTEOTOMY Right 05/05/2016   Procedure: CALCANEAL OSTEOTOMY;  Surgeon: Earnestine Leys, MD;  Location: [facility] ORS;  Service: Orthopedics;  Laterality: Right;  . DILATION AND CURETTAGE OF UTERUS  July 2015   [facility]  . FOOT SURGERY Left   . POLYPECTOMY  05/2014     OB History   None      Home Medications    Prior to Admission medications   Medication Sig Start Date End Date Taking? Authorizing Provider  albuterol (PROVENTIL HFA;VENTOLIN HFA) 108 (90 BASE) MCG/ACT inhaler Inhale 1 puff into the lungs every 6 (six) hours as needed for wheezing or shortness of breath.     [provider]  enalapril (VASOTEC) 2.5 MG tablet Take 1 tablet (2.5 mg total) by mouth daily. For BP and kidneys 04/15/16   Lada, Satira Anis, MD  gabapentin (NEURONTIN) 400 MG capsule Take 1 capsule (400 mg total) by mouth 3 (three) times daily. (don't stop abruptly) 04/15/16   Lada, Satira Anis, MD  HYDROcodone-acetaminophen (NORCO) 7.5-325 MG tablet Take 1-2 tablets by mouth every 6 (six) hours as needed for moderate  pain. 05/05/16   Earnestine Leys, MD  loperamide (IMODIUM A-D) 2 MG tablet Take 1 tablet (2 mg total) by mouth 4 (four) times daily as needed for diarrhea or loose stools. 12/18/15   Cuthriell, Charline Bills, PA-C  meloxicam (MOBIC) 15 MG tablet Take 1 tablet (15 mg total) by mouth daily. (do not take with ibuprofen) 04/15/16   Lada, Satira Anis, MD  metFORMIN (GLUCOPHAGE) 500 MG tablet Take 2 tablets (1,000 mg total) by mouth 2 (two) times daily with a meal.  04/15/16   Lada, Satira Anis, MD  methocarbamol (ROBAXIN) 500 MG tablet Take 2 tablets (1,000 mg total) by mouth 4 (four) times daily. 09/15/18   Carlisle Cater, PA-C  ranitidine (ZANTAC) 150 MG tablet Take 1 tablet (150 mg total) by mouth 2 (two) times daily as needed for heartburn. This replaces pantoprazole 04/15/16   Lada, Satira Anis, MD  traMADol (ULTRAM) 50 MG tablet Take 1 tablet (50 mg total) by mouth every 6 (six) hours as needed. 01/24/16   Harvest Dark, MD    Family History Family History  Problem Relation Age of Onset  . Arthritis Mother   . Hypertension Mother   . Drug abuse Father   . Depression Brother   . Arthritis Paternal Grandmother   . Diabetes Paternal Grandmother   . Arthritis Paternal Grandfather   . Cancer Paternal Grandfather        colon  . Diabetes Paternal Grandfather   . Asthma Daughter   . Heart defect Daughter        ventricular septic  . Breast cancer Neg Hx     Social History Social History   Tobacco Use  . Smoking status: Former Smoker    Types: Cigarettes    Last attempt to quit: 04/26/2006    Years since quitting: 12.3  . Smokeless tobacco: Never Used  Substance Use Topics  . Alcohol use: No    Alcohol/week: 0.0 standard drinks  . Drug use: No     Allergies   Penicillins   Review of Systems Review of Systems  Constitutional: Negative for fever and unexpected weight change.  Gastrointestinal: Negative for constipation.       Negative for fecal incontinence.   Genitourinary: Negative for dysuria, flank pain, hematuria and pelvic pain.       Negative for urinary incontinence or retention.  Musculoskeletal: Positive for back pain and myalgias.  Neurological: Negative for weakness and numbness.       Denies saddle paresthesias.     Physical Exam Updated Vital Signs BP 136/82 (BP Location: Left Arm)   Pulse 79   Temp 98.7 F (37.1 C) (Oral)   Resp 16   Ht 5' 7.5" (1.715 m)   Wt 112.9 kg   LMP 08/25/2018   SpO2 100%   BMI  38.42 kg/m   Physical Exam  Constitutional: She appears well-developed and well-nourished.  HENT:  Head: Normocephalic and atraumatic.  Eyes: Conjunctivae are normal.  Neck: Normal range of motion. Neck supple.  Pulmonary/Chest: Effort normal.  Abdominal: Soft. There is no tenderness. There is no CVA tenderness.  Musculoskeletal: Normal range of motion.       Cervical back: She exhibits normal range of motion, no tenderness and no bony tenderness.       Thoracic back: She exhibits tenderness. She exhibits normal range of motion and no bony tenderness.       Lumbar back: She exhibits normal range of motion, no tenderness and no bony tenderness.  Back:  No step-off noted with palpation of spine.   Neurological: She is alert. She has normal strength and normal reflexes. No sensory deficit.  5/5 strength in entire lower extremities bilaterally. No sensation deficit.   Skin: Skin is warm and dry. No rash noted.  Psychiatric: She has a normal mood and affect.  Nursing note and vitals reviewed.    ED Treatments / Results  Labs (all labs ordered are listed, but only abnormal results are displayed) Labs Reviewed - No data to display  EKG None  Radiology No results found.  Procedures Procedures (including critical care time)  Medications Ordered in ED Medications  methocarbamol (ROBAXIN) tablet 1,000 mg (1,000 mg Oral Given 09/15/18 2007)     Initial Impression / Assessment and Plan / ED Course  I have reviewed the triage vital signs and the nursing notes.  Pertinent labs & imaging results that were available during my care of the patient were reviewed by me and considered in my medical decision making (see chart for details).     8:20 PM Patient seen and examined. Medications ordered.   Vital signs reviewed and are as follows: Vitals:   09/15/18 1846  BP: 136/82  Pulse: 79  Resp: 16  Temp: 98.7 F (37.1 C)  SpO2: 100%    No red flag s/s of low back pain.  Patient was counseled on back pain precautions and told to do activity as tolerated but do not lift, push, or pull heavy objects more than 10 pounds for the next week.  Patient counseled to use ice or heat on back for no longer than 15 minutes every hour.   Patient counseled on proper use of muscle relaxant medication.  They were told not to drink alcohol, drive any vehicle, or do any dangerous activities while taking this medication.  Patient verbalized understanding.  Patient urged to follow-up with PCP if pain does not improve with treatment and rest or if pain becomes recurrent. Urged to return with worsening severe pain, loss of bowel or bladder control, trouble walking.   The patient verbalizes understanding and agrees with the plan.  Final Clinical Impressions(s) / ED Diagnoses   Final diagnoses:  Spasm of back muscles   Patient with back pain 2/2 spasms. No neurological deficits. Patient is ambulatory. No warning symptoms of back pain including: fecal incontinence, urinary retention or overflow incontinence, night sweats, waking from sleep with back pain, unexplained fevers or weight loss, h/o cancer, IVDU, recent trauma. No concern for cauda equina, epidural abscess, or other serious cause of back pain. Conservative measures such as rest, ice/heat and pain medicine indicated with PCP follow-up if no improvement with conservative management.    ED Discharge Orders         Ordered    methocarbamol (ROBAXIN) 500 MG tablet  4 times daily     09/15/18 1952           Carlisle Cater, Hershal Coria 09/15/18 2020    Hayden Rasmussen, MD 09/16/18 [phone]

## 2018-09-15 NOTE — Discharge Instructions (Signed)
Please read and follow all provided instructions.  Your diagnoses today include:  1. Spasm of back muscles     Tests performed today include:  Vital signs - see below for your results today  Medications prescribed:   Robaxin (methocarbamol) - muscle relaxer medication  DO NOT drive or perform any activities that require you to be awake and alert because this medicine can make you drowsy.   Take any prescribed medications only as directed.  Home care instructions:   Follow any educational materials contained in this packet  Please rest, use ice or heat on your back for the next several days  Do not lift, push, pull anything more than 10 pounds for the next week  Follow-up instructions: Please follow-up with your primary care provider in the next 5 days for further evaluation of your symptoms if not improved.   Return instructions:  SEEK IMMEDIATE MEDICAL ATTENTION IF YOU HAVE:  New numbness, tingling, weakness, or problem with the use of your arms or legs  Severe back pain not relieved with medications  Loss control of your bowels or bladder  Increasing pain in any areas of the body (such as chest or abdominal pain)  Shortness of breath, dizziness, or fainting.   Worsening nausea (feeling sick to your stomach), vomiting, fever, or sweats  Any other emergent concerns regarding your health   Additional Information:  Your vital signs today were: BP 136/82 (BP Location: Left Arm)    Pulse 79    Temp 98.7 F (37.1 C) (Oral)    Resp 16    Ht 5' 7.5" (1.715 m)    Wt 112.9 kg    LMP 08/25/2018    SpO2 100%    BMI 38.42 kg/m  If your blood pressure (BP) was elevated above 135/85 this visit, please have this repeated by your doctor within one month. --------------

## 2018-09-15 NOTE — ED Triage Notes (Signed)
Patient c/o right mid back pain x 3 days.. 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

## 2018-11-21 ENCOUNTER — Other Ambulatory Visit: Payer: Self-pay | Admitting: Internal Medicine

## 2018-11-21 DIAGNOSIS — Z1231 Encounter for screening mammogram for malignant neoplasm of breast: Secondary | ICD-10-CM

## 2018-12-18 DIAGNOSIS — R809 Proteinuria, unspecified: Secondary | ICD-10-CM | POA: Diagnosis not present

## 2018-12-18 DIAGNOSIS — E1129 Type 2 diabetes mellitus with other diabetic kidney complication: Secondary | ICD-10-CM | POA: Diagnosis not present

## 2018-12-25 ENCOUNTER — Ambulatory Visit
Admission: RE | Admit: 2018-12-25 | Discharge: 2018-12-25 | Disposition: A | Discharge status: Home / Self Care | Payer: 59 | Source: Ambulatory Visit | Attending: Internal Medicine | Admitting: Internal Medicine

## 2018-12-25 DIAGNOSIS — Z1231 Encounter for screening mammogram for malignant neoplasm of breast: Secondary | ICD-10-CM

## 2018-12-25 DIAGNOSIS — E1129 Type 2 diabetes mellitus with other diabetic kidney complication: Secondary | ICD-10-CM | POA: Diagnosis not present

## 2018-12-25 DIAGNOSIS — I10 Essential (primary) hypertension: Secondary | ICD-10-CM | POA: Diagnosis not present

## 2018-12-25 DIAGNOSIS — R809 Proteinuria, unspecified: Secondary | ICD-10-CM | POA: Diagnosis not present

## 2018-12-27 ENCOUNTER — Other Ambulatory Visit: Payer: Self-pay | Admitting: Internal Medicine

## 2018-12-27 DIAGNOSIS — N6320 Unspecified lump in the left breast, unspecified quadrant: Secondary | ICD-10-CM

## 2018-12-27 DIAGNOSIS — R928 Other abnormal and inconclusive findings on diagnostic imaging of breast: Secondary | ICD-10-CM

## 2019-01-05 ENCOUNTER — Ambulatory Visit
Admission: RE | Admit: 2019-01-05 | Discharge: 2019-01-05 | Disposition: A | Discharge status: Home / Self Care | Payer: 59 | Source: Ambulatory Visit | Attending: Internal Medicine | Admitting: Internal Medicine

## 2019-01-05 DIAGNOSIS — N6320 Unspecified lump in the left breast, unspecified quadrant: Secondary | ICD-10-CM | POA: Diagnosis not present

## 2019-01-05 DIAGNOSIS — R928 Other abnormal and inconclusive findings on diagnostic imaging of breast: Secondary | ICD-10-CM | POA: Diagnosis not present

## 2019-01-05 DIAGNOSIS — N6322 Unspecified lump in the left breast, upper inner quadrant: Secondary | ICD-10-CM | POA: Diagnosis not present

## 2019-01-08 ENCOUNTER — Other Ambulatory Visit: Payer: Self-pay | Admitting: Internal Medicine

## 2019-01-08 DIAGNOSIS — N6002 Solitary cyst of left breast: Secondary | ICD-10-CM

## 2019-03-13 DIAGNOSIS — R5381 Other malaise: Secondary | ICD-10-CM | POA: Diagnosis not present

## 2019-03-13 DIAGNOSIS — R51 Headache: Secondary | ICD-10-CM | POA: Diagnosis not present

## 2019-03-13 DIAGNOSIS — R5383 Other fatigue: Secondary | ICD-10-CM | POA: Diagnosis not present

## 2019-03-13 DIAGNOSIS — R52 Pain, unspecified: Secondary | ICD-10-CM | POA: Diagnosis not present

## 2019-03-19 DIAGNOSIS — E1129 Type 2 diabetes mellitus with other diabetic kidney complication: Secondary | ICD-10-CM | POA: Diagnosis not present

## 2019-03-19 DIAGNOSIS — R809 Proteinuria, unspecified: Secondary | ICD-10-CM | POA: Diagnosis not present

## 2019-03-26 DIAGNOSIS — K219 Gastro-esophageal reflux disease without esophagitis: Secondary | ICD-10-CM | POA: Diagnosis not present

## 2019-03-26 DIAGNOSIS — I10 Essential (primary) hypertension: Secondary | ICD-10-CM | POA: Diagnosis not present

## 2019-03-26 DIAGNOSIS — E1129 Type 2 diabetes mellitus with other diabetic kidney complication: Secondary | ICD-10-CM | POA: Diagnosis not present

## 2019-07-19 ENCOUNTER — Ambulatory Visit
Admission: RE | Admit: 2019-07-19 | Discharge: 2019-07-19 | Disposition: A | Discharge status: Home / Self Care | Payer: 59 | Source: Ambulatory Visit | Attending: Internal Medicine | Admitting: Internal Medicine

## 2019-07-19 DIAGNOSIS — N6002 Solitary cyst of left breast: Secondary | ICD-10-CM | POA: Diagnosis present

## 2019-08-04 ENCOUNTER — Other Ambulatory Visit: Payer: Self-pay

## 2019-08-04 ENCOUNTER — Emergency Department: Payer: 59

## 2019-08-04 ENCOUNTER — Encounter: Payer: Self-pay | Admitting: Emergency Medicine

## 2019-08-04 ENCOUNTER — Emergency Department
Admission: EM | Admit: 2019-08-04 | Discharge: 2019-08-04 | Disposition: A | Discharge status: Home / Self Care | Payer: 59 | Attending: Emergency Medicine | Admitting: Emergency Medicine

## 2019-08-04 DIAGNOSIS — T148XXA Other injury of unspecified body region, initial encounter: Secondary | ICD-10-CM

## 2019-08-04 DIAGNOSIS — Z87891 Personal history of nicotine dependence: Secondary | ICD-10-CM | POA: Diagnosis not present

## 2019-08-04 DIAGNOSIS — Z7984 Long term (current) use of oral hypoglycemic drugs: Secondary | ICD-10-CM | POA: Insufficient documentation

## 2019-08-04 DIAGNOSIS — I10 Essential (primary) hypertension: Secondary | ICD-10-CM | POA: Insufficient documentation

## 2019-08-04 DIAGNOSIS — Z79899 Other long term (current) drug therapy: Secondary | ICD-10-CM | POA: Insufficient documentation

## 2019-08-04 DIAGNOSIS — R109 Unspecified abdominal pain: Secondary | ICD-10-CM

## 2019-08-04 DIAGNOSIS — E119 Type 2 diabetes mellitus without complications: Secondary | ICD-10-CM | POA: Diagnosis not present

## 2019-08-04 DIAGNOSIS — R1032 Left lower quadrant pain: Secondary | ICD-10-CM | POA: Diagnosis not present

## 2019-08-04 DIAGNOSIS — R1031 Right lower quadrant pain: Secondary | ICD-10-CM | POA: Diagnosis present

## 2019-08-04 DIAGNOSIS — J45909 Unspecified asthma, uncomplicated: Secondary | ICD-10-CM | POA: Diagnosis not present

## 2019-08-04 LAB — URINALYSIS, COMPLETE (UACMP) WITH MICROSCOPIC
Bacteria, UA: NONE SEEN
Bilirubin Urine: NEGATIVE
Glucose, UA: NEGATIVE mg/dL
Hgb urine dipstick: NEGATIVE
Ketones, ur: NEGATIVE mg/dL
Nitrite: NEGATIVE
Protein, ur: NEGATIVE mg/dL
Specific Gravity, Urine: 1.027 (ref 1.005–1.030)
pH: 5 (ref 5.0–8.0)

## 2019-08-04 LAB — BASIC METABOLIC PANEL
Anion gap: 10 (ref 5–15)
BUN: 13 mg/dL (ref 6–20)
CO2: 24 mmol/L (ref 22–32)
Calcium: 8.8 mg/dL — ABNORMAL LOW (ref 8.9–10.3)
Chloride: 104 mmol/L (ref 98–111)
Creatinine, Ser: 0.59 mg/dL (ref 0.44–1.00)
GFR calc Af Amer: >60 mL/min (ref >60)
GFR calc non Af Amer: >60 mL/min (ref >60)
Glucose, Bld: 170 mg/dL — ABNORMAL HIGH (ref 70–99)
Potassium: 3.4 mmol/L — ABNORMAL LOW (ref 3.5–5.1)
Sodium: 138 mmol/L (ref 135–145)

## 2019-08-04 LAB — POCT PREGNANCY, URINE: Preg Test, Ur: NEGATIVE

## 2019-08-04 LAB — CBC WITH DIFFERENTIAL/PLATELET
Abs Immature Granulocytes: 0.03 10*3/uL (ref 0.00–0.07)
Basophils Absolute: 0 10*3/uL (ref 0.0–0.1)
Basophils Relative: 0 %
Eosinophils Absolute: 0.1 10*3/uL (ref 0.0–0.5)
Eosinophils Relative: 1 %
HCT: 41 % (ref 36.0–46.0)
Hemoglobin: 13.5 g/dL (ref 12.0–15.0)
Immature Granulocytes: 0 %
Lymphocytes Relative: 31 %
Lymphs Abs: 3.1 10*3/uL (ref 0.7–4.0)
MCH: 27.1 pg (ref 26.0–34.0)
MCHC: 32.9 g/dL (ref 30.0–36.0)
MCV: 82.3 fL (ref 80.0–100.0)
Monocytes Absolute: 0.5 10*3/uL (ref 0.1–1.0)
Monocytes Relative: 5 %
Neutro Abs: 6.3 10*3/uL (ref 1.7–7.7)
Neutrophils Relative %: 63 %
Platelets: 254 10*3/uL (ref 150–400)
RBC: 4.98 MIL/uL (ref 3.87–5.11)
RDW: 16 % — ABNORMAL HIGH (ref 11.5–15.5)
WBC: 10.1 10*3/uL (ref 4.0–10.5)
nRBC: 0 % (ref 0.0–0.2)

## 2019-08-04 MED ORDER — LACTATED RINGERS IV BOLUS
1000.0000 mL | Freq: Once | INTRAVENOUS | Status: AC
  Administered 2019-08-04: 16:00:00 1000 mL via INTRAVENOUS

## 2019-08-04 MED ORDER — KETOROLAC TROMETHAMINE 30 MG/ML IJ SOLN
15.0000 mg | Freq: Once | INTRAMUSCULAR | Status: AC
  Administered 2019-08-04: 16:00:00 15 mg via INTRAVENOUS
  Filled 2019-08-04: qty 1

## 2019-08-04 MED ORDER — ONDANSETRON HCL 4 MG/2ML IJ SOLN
4.0000 mg | Freq: Once | INTRAMUSCULAR | Status: AC
  Administered 2019-08-04: 16:00:00 4 mg via INTRAVENOUS
  Filled 2019-08-04: qty 2

## 2019-08-04 NOTE — ED Triage Notes (Signed)
Bilateral flank pain x 3 days. denies fevers or dysuria.

## 2019-08-04 NOTE — ED Provider Notes (Signed)
[facility] Emergency Department Provider Note   ____________________________________________   First MD Initiated Contact with Patient 08/04/19 1546     (approximate)  I have reviewed the triage vital signs and the nursing notes.   HISTORY  Chief Complaint Flank Pain    HPI Virginia [name] is a 48 y.o. female with past medical history of hypertension and diabetes who presents to the ED complaining of flank pain.  Patient reports she has been dealing with constant waxing and waning pain to her bilateral flanks for the past 3 days.  She describes the pain as sharp and stabbing, not exacerbated or alleviated by anything in particular.  She states she has been feeling nauseous with this but has not vomited.  She denies any associated fevers, dysuria, hematuria, or bowel changes.  She denies any history of kidney stones, but states she wants her kidneys checked on because she is diabetic.        Past Medical History:  Diagnosis Date  . AC (acromioclavicular) joint bone spurs   . Acid reflux 04/15/2016  . Arthritis   . Asthma   . Calcium oxalate crystals in urine 04/30/2016  . Depression    history of   . Diabetes mellitus without complication ([place])   . Hypertension   . Neuropathy of left foot 04/15/2016  . Obesity (BMI 30-39.9) 06/01/2011  . Plantar fasciitis of right foot   . Recent urinary tract infection 04/15/2016    Patient Active Problem List   Diagnosis Date Noted  . Achilles tendonitis 05/09/2016  . Pre-operative exam 05/09/2016  . Proteinuria 05/04/2016  . Hematuria, microscopic 05/04/2016  . Screening for HIV (human immunodeficiency virus) 04/30/2016  . Calcium oxalate crystals in urine 04/30/2016  . Glucosuria 04/15/2016  . Recent urinary tract infection 04/15/2016  . Medication monitoring encounter 04/15/2016  . Chronic pain of right heel 04/15/2016  . Acid reflux 04/15/2016  . Neuropathy of left foot 04/15/2016  . Hypertension goal  BP (blood pressure) < 140/90 09/16/2015  . Asthma, mild intermittent, well-controlled 09/16/2015  . Insomnia, uncontrolled 09/16/2015  . Ankle pain 06/01/2011  . Uncontrolled diabetes mellitus type 2 without complications ([place]) 123456  . Dermatitis, eczematoid 06/01/2011  . HLD (hyperlipidemia) 06/01/2011  . Microcytic anemia 06/01/2011  . Obesity (BMI 30-39.9) 06/01/2011  . Bilateral polycystic ovarian syndrome 06/01/2011  . Abnormal Pap smear of cervix 06/01/2011    Past Surgical History:  Procedure Laterality Date  . CALCANEAL OSTEOTOMY Right 05/05/2016   Procedure: CALCANEAL OSTEOTOMY;  Surgeon: Earnestine Leys, MD;  Location: [facility] ORS;  Service: Orthopedics;  Laterality: Right;  . DILATION AND CURETTAGE OF UTERUS  July 2015   [facility]  . FOOT SURGERY Left   . POLYPECTOMY  05/2014    Prior to Admission medications   Medication Sig Start Date End Date Taking? Authorizing Provider  albuterol (PROVENTIL HFA;VENTOLIN HFA) 108 (90 BASE) MCG/ACT inhaler Inhale 1 puff into the lungs every 6 (six) hours as needed for wheezing or shortness of breath.     [provider]  enalapril (VASOTEC) 2.5 MG tablet Take 1 tablet (2.5 mg total) by mouth daily. For BP and kidneys 04/15/16   Lada, Satira Anis, MD  gabapentin (NEURONTIN) 400 MG capsule Take 1 capsule (400 mg total) by mouth 3 (three) times daily. (don't stop abruptly) 04/15/16   Lada, Satira Anis, MD  HYDROcodone-acetaminophen (NORCO) 7.5-325 MG tablet Take 1-2 tablets by mouth every 6 (six) hours as needed for moderate pain. 05/05/16  Earnestine Leys, MD  loperamide (IMODIUM A-D) 2 MG tablet Take 1 tablet (2 mg total) by mouth 4 (four) times daily as needed for diarrhea or loose stools. 12/18/15   Cuthriell, Charline Bills, PA-C  meloxicam (MOBIC) 15 MG tablet Take 1 tablet (15 mg total) by mouth daily. (do not take with ibuprofen) 04/15/16   Lada, Satira Anis, MD  metFORMIN (GLUCOPHAGE) 500 MG tablet Take 2 tablets (1,000 mg total) by mouth 2  (two) times daily with a meal. 04/15/16   Lada, Satira Anis, MD  methocarbamol (ROBAXIN) 500 MG tablet Take 2 tablets (1,000 mg total) by mouth 4 (four) times daily. 09/15/18   Carlisle Cater, PA-C  ranitidine (ZANTAC) 150 MG tablet Take 1 tablet (150 mg total) by mouth 2 (two) times daily as needed for heartburn. This replaces pantoprazole 04/15/16   Lada, Satira Anis, MD  traMADol (ULTRAM) 50 MG tablet Take 1 tablet (50 mg total) by mouth every 6 (six) hours as needed. 01/24/16   Harvest Dark, MD    Allergies Penicillins  Family History  Problem Relation Age of Onset  . Arthritis Mother   . Hypertension Mother   . Drug abuse Father   . Depression Brother   . Arthritis Paternal Grandmother   . Diabetes Paternal Grandmother   . Arthritis Paternal Grandfather   . Cancer Paternal Grandfather        colon  . Diabetes Paternal Grandfather   . Asthma Daughter   . Heart defect Daughter        ventricular septic  . Breast cancer Neg Hx     Social History Social History   Tobacco Use  . Smoking status: Former Smoker    Types: Cigarettes    Quit date: 04/26/2006    Years since quitting: 13.2  . Smokeless tobacco: Never Used  Substance Use Topics  . Alcohol use: No    Alcohol/week: 0.0 standard drinks  . Drug use: No    Review of Systems  Constitutional: No fever/chills Eyes: No visual changes. ENT: No sore throat. Cardiovascular: Denies chest pain. Respiratory: Denies shortness of breath. Gastrointestinal: No abdominal pain.  No nausea, no vomiting.  No diarrhea.  No constipation.  Positive for flank pain. Genitourinary: Negative for dysuria. Musculoskeletal: Negative for back pain. Skin: Negative for rash. Neurological: Negative for headaches, focal weakness or numbness.  ____________________________________________   PHYSICAL EXAM:  VITAL SIGNS: ED Triage Vitals  Enc Vitals Group     BP 08/04/19 1502 (!) 126/43     Pulse Rate 08/04/19 1502 81     Resp 08/04/19  1502 20     Temp 08/04/19 1502 98.2 F (36.8 C)     Temp Source 08/04/19 1502 Oral     SpO2 08/04/19 1502 99 %     Weight 08/04/19 1503 240 lb (108.9 kg)     Height 08/04/19 1503 5' 7.5" (1.715 m)     Head Circumference --      Peak Flow --      Pain Score 08/04/19 1503 6     Pain Loc --      Pain Edu? --      Excl. in [place]? --     Constitutional: Alert and oriented. Eyes: Conjunctivae are normal. Head: Atraumatic. Nose: No congestion/rhinnorhea. Mouth/Throat: Mucous membranes are moist. Neck: Normal ROM Cardiovascular: Normal rate, regular rhythm. Grossly normal heart sounds. Respiratory: Normal respiratory effort.  No retractions. Lungs CTAB. Gastrointestinal: Soft and nontender. No distention.  CVA tenderness bilaterally. Genitourinary: deferred  Musculoskeletal: No lower extremity tenderness nor edema. Neurologic:  Normal speech and language. No gross focal neurologic deficits are appreciated. Skin:  Skin is warm, dry and intact. No rash noted. Psychiatric: Mood and affect are normal. Speech and behavior are normal.  ____________________________________________   LABS (all labs ordered are listed, but only abnormal results are displayed)  Labs Reviewed  URINALYSIS, COMPLETE (UACMP) WITH MICROSCOPIC - Abnormal; Notable for the following components:      Result Value   Color, Urine YELLOW (*)    APPearance HAZY (*)    Leukocytes,Ua TRACE (*)    All other components within normal limits  BASIC METABOLIC PANEL - Abnormal; Notable for the following components:   Potassium 3.4 (*)    Glucose, Bld 170 (*)    Calcium 8.8 (*)    All other components within normal limits  CBC WITH DIFFERENTIAL/PLATELET - Abnormal; Notable for the following components:   RDW 16.0 (*)    All other components within normal limits  POC URINE PREG, ED  POCT PREGNANCY, URINE     PROCEDURES  Procedure(s) performed (including Critical Care):  Procedures    ____________________________________________   INITIAL IMPRESSION / ASSESSMENT AND PLAN / ED COURSE       48 year old female presenting to the ED with waxing and waning bilateral flank pain for the past 3 days.  Urine pregnancy is negative and UA without evidence of infection.  Will check CT for nephrolithiasis, if this is unremarkable, suspect musculoskeletal etiology of patient's symptoms.  Labs thus far unremarkable, renal function and electrolytes within normal limits.  No respiratory symptoms to suggest pulmonary process and no pleuritic component to suggest PE, patient is PERC negative.  CT negative for acute process, no evidence of obstructive nephrolith.  Suspect musculoskeletal etiology of symptoms, counseled patient to use anti-inflammatories as needed and to follow-up with PCP.  Counseled to return to the ED for new or worsening symptoms, patient agrees with plan.      ____________________________________________   FINAL CLINICAL IMPRESSION(S) / ED DIAGNOSES  Final diagnoses:  Bilateral flank pain  Muscle strain     ED Discharge Orders    None       Note:  This document was prepared using Dragon voice recognition software and may include unintentional dictation errors.   Blake Divine, MD 08/05/19 [phone]

## 2019-08-04 NOTE — ED Notes (Signed)
Pt gone to Renal Study

## 2020-07-07 ENCOUNTER — Ambulatory Visit: Payer: 59 | Attending: Internal Medicine

## 2020-07-07 ENCOUNTER — Ambulatory Visit: Payer: 59

## 2020-07-07 DIAGNOSIS — Z23 Encounter for immunization: Secondary | ICD-10-CM

## 2020-07-07 NOTE — Progress Notes (Signed)
° °  Covid-19 Vaccination Clinic  Name:  Virginia [name]    MRN: [id number] DOB: [birth year]  07/07/2020  Ms. Graig was observed post Covid-19 immunization for 15 minutes without incident. She was provided with Vaccine Information Sheet and instruction to access the V-Safe system.   Ms. Rubalcava was instructed to call 911 with any severe reactions post vaccine:  Difficulty breathing   Swelling of face and throat   A fast heartbeat   A bad rash all over body   Dizziness and weakness   Immunizations Administered    Name Date Dose VIS Date Route   Pfizer COVID-19 Vaccine 07/07/2020  3:51 PM 0.3 mL 01/09/2019 Intramuscular   Manufacturer: [place]   Lot: [id number]   [place]: [id number]

## 2020-07-28 ENCOUNTER — Ambulatory Visit: Payer: 59 | Attending: Internal Medicine

## 2020-07-28 DIAGNOSIS — Z23 Encounter for immunization: Secondary | ICD-10-CM

## 2020-07-28 NOTE — Progress Notes (Signed)
   Covid-19 Vaccination Clinic  Name:  Virginia [name]    MRN: [id number] DOB: [birth year]  07/28/2020  Virginia [name] was observed post Covid-19 immunization for 15 minutes without incident. She was provided with Vaccine Information Sheet and instruction to access the V-Safe system.   Virginia [name] was instructed to call 911 with any severe reactions post vaccine: Marland Kitchen Difficulty breathing  . Swelling of face and throat  . A fast heartbeat  . A bad rash all over body  . Dizziness and weakness   Immunizations Administered    Name Date Dose VIS Date Route   Pfizer COVID-19 Vaccine 07/28/2020  3:33 PM 0.3 mL 01/09/2019 Intramuscular   Manufacturer: [employer]   Lot: [id number]   [place]: [id number]

## 2020-09-23 ENCOUNTER — Emergency Department: Payer: 59

## 2020-09-23 ENCOUNTER — Encounter: Payer: Self-pay | Admitting: Emergency Medicine

## 2020-09-23 ENCOUNTER — Other Ambulatory Visit: Payer: Self-pay

## 2020-09-23 ENCOUNTER — Emergency Department
Admission: EM | Admit: 2020-09-23 | Discharge: 2020-09-23 | Disposition: A | Discharge status: Home / Self Care | Payer: 59 | Attending: Emergency Medicine | Admitting: Emergency Medicine

## 2020-09-23 DIAGNOSIS — R109 Unspecified abdominal pain: Secondary | ICD-10-CM | POA: Insufficient documentation

## 2020-09-23 DIAGNOSIS — I10 Essential (primary) hypertension: Secondary | ICD-10-CM | POA: Diagnosis not present

## 2020-09-23 DIAGNOSIS — J4520 Mild intermittent asthma, uncomplicated: Secondary | ICD-10-CM | POA: Insufficient documentation

## 2020-09-23 DIAGNOSIS — R112 Nausea with vomiting, unspecified: Secondary | ICD-10-CM | POA: Diagnosis not present

## 2020-09-23 DIAGNOSIS — E119 Type 2 diabetes mellitus without complications: Secondary | ICD-10-CM | POA: Insufficient documentation

## 2020-09-23 DIAGNOSIS — R6883 Chills (without fever): Secondary | ICD-10-CM | POA: Diagnosis not present

## 2020-09-23 DIAGNOSIS — Z794 Long term (current) use of insulin: Secondary | ICD-10-CM | POA: Diagnosis not present

## 2020-09-23 DIAGNOSIS — F17210 Nicotine dependence, cigarettes, uncomplicated: Secondary | ICD-10-CM | POA: Insufficient documentation

## 2020-09-23 DIAGNOSIS — Z79899 Other long term (current) drug therapy: Secondary | ICD-10-CM | POA: Diagnosis not present

## 2020-09-23 LAB — COMPREHENSIVE METABOLIC PANEL
ALT: 24 U/L (ref 0–44)
AST: 19 U/L (ref 15–41)
Albumin: 3.7 g/dL (ref 3.5–5.0)
Alkaline Phosphatase: 61 U/L (ref 38–126)
Anion gap: 9 (ref 5–15)
BUN: 14 mg/dL (ref 6–20)
CO2: 25 mmol/L (ref 22–32)
Calcium: 9.2 mg/dL (ref 8.9–10.3)
Chloride: 104 mmol/L (ref 98–111)
Creatinine, Ser: 0.65 mg/dL (ref 0.44–1.00)
GFR, Estimated: >60 mL/min (ref >60)
Glucose, Bld: 127 mg/dL — ABNORMAL HIGH (ref 70–99)
Potassium: 3.8 mmol/L (ref 3.5–5.1)
Sodium: 138 mmol/L (ref 135–145)
Total Bilirubin: 0.6 mg/dL (ref 0.3–1.2)
Total Protein: 7.2 g/dL (ref 6.5–8.1)

## 2020-09-23 LAB — URINALYSIS, COMPLETE (UACMP) WITH MICROSCOPIC
Bilirubin Urine: NEGATIVE
Glucose, UA: NEGATIVE mg/dL
Ketones, ur: NEGATIVE mg/dL
Leukocytes,Ua: NEGATIVE
Nitrite: NEGATIVE
Protein, ur: NEGATIVE mg/dL
Specific Gravity, Urine: 1.02 (ref 1.005–1.030)
pH: 6 (ref 5.0–8.0)

## 2020-09-23 LAB — CBC
HCT: 43.9 % (ref 36.0–46.0)
Hemoglobin: 14.3 g/dL (ref 12.0–15.0)
MCH: 27 pg (ref 26.0–34.0)
MCHC: 32.6 g/dL (ref 30.0–36.0)
MCV: 83 fL (ref 80.0–100.0)
Platelets: 231 10*3/uL (ref 150–400)
RBC: 5.29 MIL/uL — ABNORMAL HIGH (ref 3.87–5.11)
RDW: 15.1 % (ref 11.5–15.5)
WBC: 9.9 10*3/uL (ref 4.0–10.5)
nRBC: 0 % (ref 0.0–0.2)

## 2020-09-23 LAB — PREGNANCY, URINE: Preg Test, Ur: NEGATIVE

## 2020-09-23 LAB — LIPASE, BLOOD: Lipase: 24 U/L (ref 11–51)

## 2020-09-23 MED ORDER — SODIUM CHLORIDE 0.9 % IV BOLUS
1000.0000 mL | Freq: Once | INTRAVENOUS | Status: AC
  Administered 2020-09-23: 1000 mL via INTRAVENOUS

## 2020-09-23 MED ORDER — ONDANSETRON HCL 4 MG/2ML IJ SOLN
4.0000 mg | Freq: Once | INTRAMUSCULAR | Status: AC
  Administered 2020-09-23: 4 mg via INTRAVENOUS
  Filled 2020-09-23: qty 2

## 2020-09-23 MED ORDER — MORPHINE SULFATE (PF) 4 MG/ML IV SOLN
6.0000 mg | Freq: Once | INTRAVENOUS | Status: AC
  Administered 2020-09-23: 6 mg via INTRAVENOUS
  Filled 2020-09-23: qty 2

## 2020-09-23 MED ORDER — TRAMADOL HCL 50 MG PO TABS
50.0000 mg | ORAL_TABLET | Freq: Four times a day (QID) | ORAL | 0 refills | Status: AC | PRN
Start: 2020-09-23 — End: 2021-09-23

## 2020-09-23 MED ORDER — KETOROLAC TROMETHAMINE 30 MG/ML IJ SOLN
15.0000 mg | Freq: Once | INTRAMUSCULAR | Status: AC
  Administered 2020-09-23: 15 mg via INTRAVENOUS
  Filled 2020-09-23: qty 1

## 2020-09-23 MED ORDER — CYCLOBENZAPRINE HCL 10 MG PO TABS: 10.0000 mg | ORAL_TABLET | Freq: Two times a day (BID) | ORAL | 0 refills | Status: AC | PRN

## 2020-09-23 NOTE — ED Provider Notes (Signed)
[facility] Emergency Department Provider Note  ____________________________________________   First MD Initiated Contact with Patient 09/23/20 1932     (approximate)  I have reviewed the triage vital signs and the nursing notes.   HISTORY  Chief Complaint Flank Pain    HPI Virginia [name] is a 49 y.o. female with past medical history as below here with back and flank pain.  The patient states that over the last several days, she has had aching, throbbing, bilateral flank pain.  The pain is localized along her bilateral sides.  It began gradually and has persistently worsened.  It seems worse with palpation and movements, as well as certain positions. No trauma. No lower ext weakness or numbness. No urinary sx, no urgency or frequency. No cough, SOB.        Past Medical History:  Diagnosis Date  . AC (acromioclavicular) joint bone spurs   . Acid reflux 04/15/2016  . Arthritis   . Asthma   . Calcium oxalate crystals in urine 04/30/2016  . Depression    history of   . Diabetes mellitus without complication ([place])   . Hypertension   . Neuropathy of left foot 04/15/2016  . Obesity (BMI 30-39.9) 06/01/2011  . Plantar fasciitis of right foot   . Recent urinary tract infection 04/15/2016    Patient Active Problem List   Diagnosis Date Noted  . Achilles tendonitis 05/09/2016  . Pre-operative exam 05/09/2016  . Proteinuria 05/04/2016  . Hematuria, microscopic 05/04/2016  . Screening for HIV (human immunodeficiency virus) 04/30/2016  . Calcium oxalate crystals in urine 04/30/2016  . Glucosuria 04/15/2016  . Recent urinary tract infection 04/15/2016  . Medication monitoring encounter 04/15/2016  . Chronic pain of right heel 04/15/2016  . Acid reflux 04/15/2016  . Neuropathy of left foot 04/15/2016  . Hypertension goal BP (blood pressure) < 140/90 09/16/2015  . Asthma, mild intermittent, well-controlled 09/16/2015  . Insomnia, uncontrolled 09/16/2015    . Ankle pain 06/01/2011  . Uncontrolled diabetes mellitus type 2 without complications 64/33/2951  . Dermatitis, eczematoid 06/01/2011  . HLD (hyperlipidemia) 06/01/2011  . Microcytic anemia 06/01/2011  . Obesity (BMI 30-39.9) 06/01/2011  . Bilateral polycystic ovarian syndrome 06/01/2011  . Abnormal Pap smear of cervix 06/01/2011    Past Surgical History:  Procedure Laterality Date  . CALCANEAL OSTEOTOMY Right 05/05/2016   Procedure: CALCANEAL OSTEOTOMY;  Surgeon: Earnestine Leys, MD;  Location: [facility] ORS;  Service: Orthopedics;  Laterality: Right;  . DILATION AND CURETTAGE OF UTERUS  July 2015   [facility]  . FOOT SURGERY Left   . POLYPECTOMY  05/2014    Prior to Admission medications   Medication Sig Start Date End Date Taking? Authorizing Provider  albuterol (PROVENTIL HFA;VENTOLIN HFA) 108 (90 BASE) MCG/ACT inhaler Inhale 1 puff into the lungs every 6 (six) hours as needed for wheezing or shortness of breath.     [provider]  cyclobenzaprine (FLEXERIL) 10 MG tablet Take 1 tablet (10 mg total) by mouth 2 (two) times daily as needed for up to 7 days for muscle spasms. 09/23/20 09/30/20  Duffy Bruce, MD  enalapril (VASOTEC) 2.5 MG tablet Take 1 tablet (2.5 mg total) by mouth daily. For BP and kidneys 04/15/16   Lada, Satira Anis, MD  gabapentin (NEURONTIN) 400 MG capsule Take 1 capsule (400 mg total) by mouth 3 (three) times daily. (don't stop abruptly) 04/15/16   Lada, Satira Anis, MD  HYDROcodone-acetaminophen (NORCO) 7.5-325 MG tablet Take 1-2 tablets by mouth  every 6 (six) hours as needed for moderate pain. 05/05/16   Earnestine Leys, MD  loperamide (IMODIUM A-D) 2 MG tablet Take 1 tablet (2 mg total) by mouth 4 (four) times daily as needed for diarrhea or loose stools. 12/18/15   Cuthriell, Charline Bills, PA-C  meloxicam (MOBIC) 15 MG tablet Take 1 tablet (15 mg total) by mouth daily. (do not take with ibuprofen) 04/15/16   Lada, Satira Anis, MD  metFORMIN (GLUCOPHAGE) 500 MG tablet  Take 2 tablets (1,000 mg total) by mouth 2 (two) times daily with a meal. 04/15/16   Lada, Satira Anis, MD  methocarbamol (ROBAXIN) 500 MG tablet Take 2 tablets (1,000 mg total) by mouth 4 (four) times daily. 09/15/18   Carlisle Cater, PA-C  ranitidine (ZANTAC) 150 MG tablet Take 1 tablet (150 mg total) by mouth 2 (two) times daily as needed for heartburn. This replaces pantoprazole 04/15/16   Lada, Satira Anis, MD  traMADol (ULTRAM) 50 MG tablet Take 1 tablet (50 mg total) by mouth every 6 (six) hours as needed for severe pain. 09/23/20 09/23/21  Duffy Bruce, MD    Allergies Penicillins  Family History  Problem Relation Age of Onset  . Arthritis Mother   . Hypertension Mother   . Drug abuse Father   . Depression Brother   . Arthritis Paternal Grandmother   . Diabetes Paternal Grandmother   . Arthritis Paternal Grandfather   . Cancer Paternal Grandfather        colon  . Diabetes Paternal Grandfather   . Asthma Daughter   . Heart defect Daughter        ventricular septic  . Breast cancer Neg Hx     Social History Social History   Tobacco Use  . Smoking status: Current Every Day Smoker    Types: Cigarettes    Last attempt to quit: 04/26/2006    Years since quitting: 14.4  . Smokeless tobacco: Never Used  . Tobacco comment: 3 cigarettes per day  Vaping Use  . Vaping Use: Never used  Substance Use Topics  . Alcohol use: No    Alcohol/week: 0.0 standard drinks  . Drug use: No    Review of Systems  Review of Systems  Constitutional: Negative for chills and fever.  HENT: Negative for sore throat.   Respiratory: Negative for shortness of breath.   Cardiovascular: Negative for chest pain.  Gastrointestinal: Negative for abdominal pain.  Genitourinary: Negative for flank pain.  Musculoskeletal: Positive for arthralgias and back pain. Negative for neck pain.  Skin: Negative for rash and wound.  Allergic/Immunologic: Negative for immunocompromised state.  Neurological: Negative  for weakness and numbness.  Hematological: Does not bruise/bleed easily.     ____________________________________________  PHYSICAL EXAM:      VITAL SIGNS: ED Triage Vitals [09/23/20 1525]  Enc Vitals Group     BP (!) 145/66     Pulse Rate 87     Resp 18     Temp 98.3 F (36.8 C)     Temp Source Oral     SpO2 98 %     Weight 220 lb (99.8 kg)     Height 5\' 7"  (1.702 m)     Head Circumference      Peak Flow      Pain Score 4     Pain Loc      Pain Edu?      Excl. in [place]?      Physical Exam Vitals and nursing note reviewed.  Constitutional:  General: She is not in acute distress.    Appearance: She is well-developed.  HENT:     Head: Normocephalic and atraumatic.  Eyes:     Conjunctiva/sclera: Conjunctivae normal.  Cardiovascular:     Rate and Rhythm: Normal rate and regular rhythm.     Heart sounds: Normal heart sounds.  Pulmonary:     Effort: Pulmonary effort is normal. No respiratory distress.     Breath sounds: No wheezing.  Abdominal:     General: There is no distension.  Musculoskeletal:     Cervical back: Neck supple.     Comments: Moderate TTP along bilateral thoracic paraspinal muscles. No midline TTP.  Skin:    General: Skin is warm.     Capillary Refill: Capillary refill takes less than 2 seconds.     Findings: No rash.  Neurological:     Mental Status: She is alert and oriented to person, place, and time.     Motor: No abnormal muscle tone.       ____________________________________________   LABS (all labs ordered are listed, but only abnormal results are displayed)  Labs Reviewed  COMPREHENSIVE METABOLIC PANEL - Abnormal; Notable for the following components:      Result Value   Glucose, Bld 127 (*)    All other components within normal limits  CBC - Abnormal; Notable for the following components:   RBC 5.29 (*)    All other components within normal limits  URINALYSIS, COMPLETE (UACMP) WITH MICROSCOPIC - Abnormal; Notable for the  following components:   Color, Urine YELLOW (*)    APPearance CLEAR (*)    Hgb urine dipstick MODERATE (*)    Bacteria, UA RARE (*)    All other components within normal limits  LIPASE, BLOOD  PREGNANCY, URINE    ____________________________________________  EKG:  ________________________________________  RADIOLOGY All imaging, including plain films, CT scans, and ultrasounds, independently reviewed by me, and interpretations confirmed via formal radiology reads.  ED MD interpretation:   CT Stone: 1 mm stone left kidney, no fx CXR: Clear, no focal abnormality  Official radiology report(s): DG Chest 2 View  Result Date: 09/23/2020 CLINICAL DATA:  Back pain EXAM: CHEST - 2 VIEW COMPARISON:  05/29/2014 FINDINGS: Lungs are well expanded, symmetric, and clear. No pneumothorax or pleural effusion. Cardiac size within normal limits. Pulmonary vascularity is normal. Osseous structures are age-appropriate. No acute bone abnormality. IMPRESSION: No active cardiopulmonary disease. Electronically Signed   By: Fidela Salisbury MD   On: 09/23/2020 20:21   CT Renal Stone Study  Result Date: 09/23/2020 CLINICAL DATA:  Bilateral flank pain nausea and vomiting EXAM: CT ABDOMEN AND PELVIS WITHOUT CONTRAST TECHNIQUE: Multidetector CT imaging of the abdomen and pelvis was performed following the standard protocol without IV contrast. COMPARISON:  August 04, 2019 FINDINGS: Lower chest: Lung bases are clear.  There is a small hiatal hernia. Hepatobiliary: No focal liver lesions are evident on this noncontrast enhanced study. Gallbladder wall is not appreciably thickened. There is no biliary duct dilatation. Pancreas: There is no pancreatic mass or inflammatory focus. Spleen: No splenic lesions are evident. Adrenals/Urinary Tract: Adrenals bilaterally appear normal. There is no evident renal mass or hydronephrosis on either side. There is a 1 mm calculus in the lower pole left kidney. There is no appreciable  ureteral calculus on either side. The urinary bladder is midline with wall thickness within normal limits. There are phleboliths which are near but felt to be separate from distal ureters. Urinary bladder is midline  with wall thickness within normal limits. Stomach/Bowel: There is no appreciable bowel wall or mesenteric thickening. There is moderate stool in the colon. There is no evident bowel obstruction. The terminal ileum appears normal. There is no evident free air or portal venous air. Vascular/Lymphatic: There is no abdominal aortic aneurysm. There is no appreciable vascular lesion on this noncontrast enhanced study. There is no evident adenopathy in the abdomen or pelvis. Reproductive: The uterus is anteverted. There is an apparent cervical nabothian cyst on the right measuring 1.0 x 0.8 cm. No adnexal masses are evident. Other: Appendix appears normal. No evident abscess or ascites in the abdomen or pelvis. There is thinning of the rectus muscle midline without hernia evident. Musculoskeletal: There are foci of degenerative change in the lower thoracic and lumbar spine regions. No blastic or lytic bone lesions. No intramuscular or abdominal wall lesions are evident. IMPRESSION: 1. 1 mm calculus lower pole left kidney. No hydronephrosis or appreciable ureteral calculus on either side. Urinary bladder wall thickness normal. 2. No bowel wall thickening or bowel obstruction. No abscess in the abdomen or pelvis. Appendix appears normal. 3.  Small hiatal hernia. 4.  Small right-sided cervical nabothian cyst. Electronically Signed   By: Lowella Grip III M.D.   On: 09/23/2020 20:28    ____________________________________________  PROCEDURES   Procedure(s) performed (including Critical Care):  Procedures  ____________________________________________  INITIAL IMPRESSION / MDM / [place] / ED COURSE  As part of my medical decision making, I reviewed the following data within the  [place] notes reviewed and incorporated, Old chart reviewed, Notes from prior ED visits, and [place] Controlled Substance Database       *Virginia [name] was evaluated in Emergency Department on 09/23/2020 for the symptoms described in the history of present illness. She was evaluated in the context of the global COVID-19 pandemic, which necessitated consideration that the patient might be at risk for infection with the SARS-CoV-2 virus that causes COVID-19. Institutional protocols and algorithms that pertain to the evaluation of patients at risk for COVID-19 are in a state of rapid change based on information released by regulatory bodies including the CDC and federal and state organizations. These policies and algorithms were followed during the patient's care in the ED.  Some ED evaluations and interventions may be delayed as a result of limited staffing during the pandemic.*     Medical Decision Making: 49 year old female here with bilateral upper flank/back pain.  Pain is reproducible on exam and worse with positions.  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  She has no lower extremity numbness or weakness.  CT scan shows no fracture or other abnormality.  She has an intrapolar stone but no evidence of obstruction stone on the left.  Clinically, she denies any dysuria, frequency, or signs of UTI.  She has a normal white count.  UA with mild pyuria but given absence of any urinary symptoms and no CVA tenderness with no leukocytosis or fever, doubt UTI or kidney infection.  CBC, CMP otherwise unremarkable.  Suspect musculoskeletal pain.  She has a history of similar issues in the past.  Will give her a brief course of analgesia and muscle relaxants.  Will continue her NSAIDs at home.  Return precautions given.  ____________________________________________  FINAL CLINICAL IMPRESSION(S) / ED DIAGNOSES  Final diagnoses:  Flank  pain     MEDICATIONS GIVEN DURING THIS VISIT:  Medications  ketorolac (TORADOL)  30 MG/ML injection 15 mg (15 mg Intravenous Given 09/23/20 2037)  morphine 4 MG/ML injection 6 mg (6 mg Intravenous Given 09/23/20 2038)  ondansetron (ZOFRAN) injection 4 mg (4 mg Intravenous Given 09/23/20 2036)  sodium chloride 0.9 % bolus 1,000 mL (0 mLs Intravenous Stopped 09/23/20 2253)     ED Discharge Orders         Ordered    cyclobenzaprine (FLEXERIL) 10 MG tablet  2 times daily PRN        09/23/20 2229    traMADol (ULTRAM) 50 MG tablet  Every 6 hours PRN        09/23/20 2229           Note:  This document was prepared using Dragon voice recognition software and may include unintentional dictation errors.   Duffy Bruce, MD 09/23/20 [phone]

## 2020-09-23 NOTE — ED Triage Notes (Signed)
Pt arrived via POV from home with c/o bilateral flank pain with spasms for 3-4 days. Pt states nausea, vomiting and chills started this morning.   Pt denies diarrhea or fever.

## 2020-09-25 LAB — URINE CULTURE

## 2022-10-26 ENCOUNTER — Ambulatory Visit
Admission: EM | Admit: 2022-10-26 | Discharge: 2022-10-26 | Disposition: A | Discharge status: Home / Self Care | Payer: 59 | Attending: Emergency Medicine | Admitting: Emergency Medicine

## 2022-10-26 DIAGNOSIS — Z1152 Encounter for screening for COVID-19: Secondary | ICD-10-CM | POA: Insufficient documentation

## 2022-10-26 DIAGNOSIS — J45901 Unspecified asthma with (acute) exacerbation: Secondary | ICD-10-CM | POA: Diagnosis present

## 2022-10-26 DIAGNOSIS — J069 Acute upper respiratory infection, unspecified: Secondary | ICD-10-CM | POA: Insufficient documentation

## 2022-10-26 DIAGNOSIS — R509 Fever, unspecified: Secondary | ICD-10-CM | POA: Insufficient documentation

## 2022-10-26 LAB — RESP PANEL BY RT-PCR (FLU A&B, COVID) ARPGX2
Influenza A by PCR: POSITIVE — AB
Influenza B by PCR: NEGATIVE
SARS Coronavirus 2 by RT PCR: NEGATIVE

## 2022-10-26 MED ORDER — ALBUTEROL SULFATE HFA 108 (90 BASE) MCG/ACT IN AERS: 1.0000 | INHALATION_SPRAY | Freq: Four times a day (QID) | RESPIRATORY_TRACT | 0 refills | Status: AC | PRN

## 2022-10-26 MED ORDER — PREDNISONE 10 MG PO TABS: 40.0000 mg | ORAL_TABLET | Freq: Every day | ORAL | 0 refills | Status: AC

## 2022-10-26 NOTE — ED Triage Notes (Addendum)
Patient to Urgent Care with complaints of dry cough, fevers, loss of appetite and body aches x2 days. Reports multiple family members sick with same symptoms.   Temp this morning of 101.6 at 0030.  Treating with theraflu and tylenol.  Tested for covid via home test that was negative.

## 2022-10-26 NOTE — Discharge Instructions (Addendum)
Use the albuterol inhaler and take the prednisone as directed.    Your COVID and Flu tests are pending.    Take Tylenol or ibuprofen as needed for fever or discomfort.  Rest and keep yourself hydrated.    Follow-up with your primary care provider.

## 2022-10-26 NOTE — ED Provider Notes (Signed)
Roderic Palau    CSN: [id number] Arrival date & time: 10/26/22  9528      History   Chief Complaint Chief Complaint  Patient presents with   Cough   Fever   Generalized Body Aches    HPI Virginia [name] is a 51 y.o. female.  Patient presents with 2-day history of fever, body aches, loss of appetite, congestion, cough.  She had some wheezing yesterday and used her albuterol inhaler.  She has had the inhaler for 3 years and is not sure if it is in date.  Max 101.6.  Treatment at home with Tylenol and TheraFlu.  Negative COVID test at home.  Patient denies chest pain, shortness of breath, rash, vomiting, diarrhea, or other symptoms.  Her medical history includes asthma, diabetes, hypertension.  The history is provided by the patient and medical records.    Past Medical History:  Diagnosis Date   AC (acromioclavicular) joint bone spurs    Acid reflux 04/15/2016   Arthritis    Asthma    Calcium oxalate crystals in urine 04/30/2016   Depression    history of    Diabetes mellitus without complication ([facility])    Hypertension    Neuropathy of left foot 04/15/2016   Obesity (BMI 30-39.9) 06/01/2011   Plantar fasciitis of right foot    Recent urinary tract infection 04/15/2016    Patient Active Problem List   Diagnosis Date Noted   Fever, unspecified 10/26/2022   Achilles tendonitis 05/09/2016   Pre-operative exam 05/09/2016   Proteinuria 05/04/2016   Hematuria, microscopic 05/04/2016   Screening for HIV (human immunodeficiency virus) 04/30/2016   Calcium oxalate crystals in urine 04/30/2016   Glucosuria 04/15/2016   Recent urinary tract infection 04/15/2016   Medication monitoring encounter 04/15/2016   Chronic pain of right heel 04/15/2016   Acid reflux 04/15/2016   Neuropathy of left foot 04/15/2016   Hypertension goal BP (blood pressure) < 140/90 09/16/2015   Asthma, mild intermittent, well-controlled 09/16/2015   Insomnia, uncontrolled 09/16/2015   Ankle pain  06/01/2011   Uncontrolled diabetes mellitus type 2 without complications 41/32/4401   Dermatitis, eczematoid 06/01/2011   HLD (hyperlipidemia) 06/01/2011   Microcytic anemia 06/01/2011   Obesity (BMI 30-39.9) 06/01/2011   Bilateral polycystic ovarian syndrome 06/01/2011   Abnormal Pap smear of cervix 06/01/2011    Past Surgical History:  Procedure Laterality Date   CALCANEAL OSTEOTOMY Right 05/05/2016   Procedure: CALCANEAL OSTEOTOMY;  Surgeon: Earnestine Leys, MD;  Location: [facility] ORS;  Service: Orthopedics;  Laterality: Right;   DILATION AND CURETTAGE OF UTERUS  July 2015   [facility]   FOOT SURGERY Left    POLYPECTOMY  05/2014    OB History   No obstetric history on file.      Home Medications    Prior to Admission medications   Medication Sig Start Date End Date Taking? Authorizing Provider  albuterol (VENTOLIN HFA) 108 (90 Base) MCG/ACT inhaler Inhale 1-2 puffs into the lungs every 6 (six) hours as needed. 10/26/22  Yes Sharion Balloon, NP  predniSONE (DELTASONE) 10 MG tablet Take 4 tablets (40 mg total) by mouth daily for 5 days. 10/26/22 10/31/22 Yes Sharion Balloon, NP  albuterol (PROVENTIL HFA;VENTOLIN HFA) 108 (90 BASE) MCG/ACT inhaler Inhale 1 puff into the lungs every 6 (six) hours as needed for wheezing or shortness of breath.     [provider]  enalapril (VASOTEC) 2.5 MG tablet Take 1 tablet (2.5 mg total) by mouth daily.  For BP and kidneys 04/15/16   Lada, Satira Anis, MD  gabapentin (NEURONTIN) 400 MG capsule Take 1 capsule (400 mg total) by mouth 3 (three) times daily. (don't stop abruptly) 04/15/16   Lada, Satira Anis, MD  HYDROcodone-acetaminophen (NORCO) 7.5-325 MG tablet Take 1-2 tablets by mouth every 6 (six) hours as needed for moderate pain. 05/05/16   Earnestine Leys, MD  loperamide (IMODIUM A-D) 2 MG tablet Take 1 tablet (2 mg total) by mouth 4 (four) times daily as needed for diarrhea or loose stools. 12/18/15   Cuthriell, Charline Bills, PA-C  meloxicam (MOBIC) 15  MG tablet Take 1 tablet (15 mg total) by mouth daily. (do not take with ibuprofen) 04/15/16   Lada, Satira Anis, MD  metFORMIN (GLUCOPHAGE) 500 MG tablet Take 2 tablets (1,000 mg total) by mouth 2 (two) times daily with a meal. 04/15/16   Lada, Satira Anis, MD  methocarbamol (ROBAXIN) 500 MG tablet Take 2 tablets (1,000 mg total) by mouth 4 (four) times daily. 09/15/18   Carlisle Cater, PA-C  ranitidine (ZANTAC) 150 MG tablet Take 1 tablet (150 mg total) by mouth 2 (two) times daily as needed for heartburn. This replaces pantoprazole 04/15/16   Lada, Satira Anis, MD    Family History Family History  Problem Relation Age of Onset   Arthritis Mother    Hypertension Mother    Drug abuse Father    Depression Brother    Arthritis Paternal Grandmother    Diabetes Paternal Grandmother    Arthritis Paternal Grandfather    Cancer Paternal Grandfather        colon   Diabetes Paternal Grandfather    Asthma Daughter    Heart defect Daughter        ventricular septic   Breast cancer Neg Hx     Social History Social History   Tobacco Use   Smoking status: Every Day    Types: Cigarettes    Last attempt to quit: 04/26/2006    Years since quitting: 16.5   Smokeless tobacco: Never   Tobacco comments:    3 cigarettes per day  Vaping Use   Vaping Use: Never used  Substance Use Topics   Alcohol use: No    Alcohol/week: 0.0 standard drinks of alcohol   Drug use: No     Allergies   Penicillins   Review of Systems Review of Systems  Constitutional:  Positive for appetite change and fever. Negative for chills.  HENT:  Negative for ear pain and sore throat.   Respiratory:  Positive for cough and wheezing. Negative for shortness of breath.   Cardiovascular:  Negative for chest pain and palpitations.  Gastrointestinal:  Negative for abdominal pain, diarrhea and vomiting.  Skin:  Negative for color change and rash.  All other systems reviewed and are negative.    Physical Exam Triage Vital  Signs ED Triage Vitals  Enc Vitals Group     BP      Pulse      Resp      Temp      Temp src      SpO2      Weight      Height      Head Circumference      Peak Flow      Pain Score      Pain Loc      Pain Edu?      Excl. in [place]?    No data found.  Updated Vital Signs BP 139/85  Pulse 86   Temp 98.8 F (37.1 C)   Resp 18   Ht '5\' 7"'$  (1.702 m)   LMP 10/26/2022   SpO2 98%   BMI 34.46 kg/m   Visual Acuity Right Eye Distance:   Left Eye Distance:   Bilateral Distance:    Right Eye Near:   Left Eye Near:    Bilateral Near:     Physical Exam Vitals and nursing note reviewed.  Constitutional:      General: She is not in acute distress.    Appearance: She is well-developed. She is not ill-appearing.  HENT:     Right Ear: Tympanic membrane normal.     Left Ear: Tympanic membrane normal.     Nose: Nose normal.     Mouth/Throat:     Mouth: Mucous membranes are moist.     Pharynx: Oropharynx is clear.  Cardiovascular:     Rate and Rhythm: Normal rate and regular rhythm.     Heart sounds: Normal heart sounds.  Pulmonary:     Effort: Pulmonary effort is normal. No respiratory distress.     Breath sounds: Normal breath sounds. No wheezing.  Musculoskeletal:     Cervical back: Neck supple.  Skin:    General: Skin is warm and dry.  Neurological:     Mental Status: She is alert.  Psychiatric:        Mood and Affect: Mood normal.        Behavior: Behavior normal.      UC Treatments / Results  Labs (all labs ordered are listed, but only abnormal results are displayed) Labs Reviewed  RESP PANEL BY RT-PCR (FLU A&B, COVID) [id number]    EKG   Radiology No results found.  Procedures Procedures (including critical care time)  Medications Ordered in UC Medications - No data to display  Initial Impression / Assessment and Plan / UC Course  I have reviewed the triage vital signs and the nursing notes.  Pertinent labs & imaging results that were available  during my care of the patient were reviewed by me and considered in my medical decision making (see chart for details).    Fever, asthma exacerbation, acute URI.  Treating with albuterol inhaler and prednisone.  COVID and Flu pending.  Discussed symptomatic treatment including Tylenol or ibuprofen, rest, hydration.  Instructed patient to follow up with PCP if symptoms are not improving.  She agrees to plan of care.   Final Clinical Impressions(s) / UC Diagnoses   Final diagnoses:  Fever, unspecified  Asthma with acute exacerbation, unspecified asthma severity, unspecified whether persistent  Acute upper respiratory infection     Discharge Instructions      Use the albuterol inhaler and take the prednisone as directed.    Your COVID and Flu tests are pending.    Take Tylenol or ibuprofen as needed for fever or discomfort.  Rest and keep yourself hydrated.    Follow-up with your primary care provider.         ED Prescriptions     Medication Sig Dispense Auth. Provider   albuterol (VENTOLIN HFA) 108 (90 Base) MCG/ACT inhaler Inhale 1-2 puffs into the lungs every 6 (six) hours as needed. 18 g Sharion Balloon, NP   predniSONE (DELTASONE) 10 MG tablet Take 4 tablets (40 mg total) by mouth daily for 5 days. 20 tablet Sharion Balloon, NP      PDMP not reviewed this encounter.   Sharion Balloon, NP 10/26/22 [phone]

## 2023-05-22 LAB — COLOGUARD: COLOGUARD: NEGATIVE

## 2023-06-24 ENCOUNTER — Other Ambulatory Visit: Payer: Self-pay | Admitting: Student

## 2023-06-24 DIAGNOSIS — Z1231 Encounter for screening mammogram for malignant neoplasm of breast: Secondary | ICD-10-CM

## 2023-06-29 ENCOUNTER — Ambulatory Visit
Admission: RE | Admit: 2023-06-29 | Discharge: 2023-06-29 | Disposition: A | Discharge status: Home / Self Care | Payer: 59 | Source: Ambulatory Visit | Attending: Student | Admitting: Student

## 2023-06-29 DIAGNOSIS — Z1231 Encounter for screening mammogram for malignant neoplasm of breast: Secondary | ICD-10-CM | POA: Diagnosis not present

## 2024-10-24 ENCOUNTER — Other Ambulatory Visit: Payer: Self-pay | Admitting: Student

## 2024-10-24 DIAGNOSIS — Z1231 Encounter for screening mammogram for malignant neoplasm of breast: Secondary | ICD-10-CM

## 2024-11-27 ENCOUNTER — Other Ambulatory Visit: Payer: Self-pay | Admitting: Medical Genetics

## 2024-11-30 ENCOUNTER — Ambulatory Visit
Admission: RE | Admit: 2024-11-30 | Discharge: 2024-11-30 | Disposition: A | Discharge status: Home / Self Care | Source: Ambulatory Visit | Attending: Student | Admitting: Student

## 2024-11-30 DIAGNOSIS — Z1231 Encounter for screening mammogram for malignant neoplasm of breast: Secondary | ICD-10-CM | POA: Insufficient documentation

## 2024-12-07 ENCOUNTER — Other Ambulatory Visit

## 2024-12-12 ENCOUNTER — Other Ambulatory Visit: Payer: Self-pay | Admitting: Student

## 2024-12-12 DIAGNOSIS — R928 Other abnormal and inconclusive findings on diagnostic imaging of breast: Secondary | ICD-10-CM

## 2024-12-13 ENCOUNTER — Ambulatory Visit
Admission: RE | Admit: 2024-12-13 | Discharge: 2024-12-13 | Disposition: A | Discharge status: Home / Self Care | Source: Ambulatory Visit | Attending: Student | Admitting: Student

## 2024-12-13 DIAGNOSIS — R928 Other abnormal and inconclusive findings on diagnostic imaging of breast: Secondary | ICD-10-CM | POA: Insufficient documentation

## 2024-12-15 ENCOUNTER — Other Ambulatory Visit: Payer: Self-pay
